# Patient Record
Sex: Female | Born: 1955 | Race: White | Hispanic: No | Marital: Married | Smoking: Never smoker
Health system: Southern US, Academic
[De-identification: ages and names within clinical notes are randomized; demographics above are authoritative.]

## PROBLEM LIST (undated history)

## (undated) DIAGNOSIS — R002 Palpitations: Secondary | ICD-10-CM

## (undated) DIAGNOSIS — I493 Ventricular premature depolarization: Secondary | ICD-10-CM

## (undated) DIAGNOSIS — E785 Hyperlipidemia, unspecified: Secondary | ICD-10-CM

## (undated) DIAGNOSIS — R001 Bradycardia, unspecified: Secondary | ICD-10-CM

## (undated) HISTORY — DX: Palpitations: R00.2

## (undated) HISTORY — DX: Bradycardia, unspecified: R00.1

## (undated) HISTORY — PX: HX LITHOTRIPSY: SHX66

## (undated) HISTORY — DX: Hyperlipidemia, unspecified: E78.5

## (undated) HISTORY — DX: Ventricular premature depolarization: I49.3

## (undated) HISTORY — PX: SINUS SURGERY: SHX187

---

## 1997-03-22 ENCOUNTER — Other Ambulatory Visit (HOSPITAL_COMMUNITY): Payer: Self-pay

## 2021-04-23 IMAGING — MG 3D SCREENING MAMMO BIL W/CAD & TOMO
5 series · 8 of 24 positions shown · non-contrast
Comparison: 12/09/2020 and 11/23/2019.

------------- REPORT [id number] -------------
﻿

JON, GENTRY
EXAM:  3D BILATERAL ANNUAL SCREENING DIGITAL MAMMOGRAM WITH CAD AND TOMOSYNTHESIS
INDICATION: Screening.

[R]
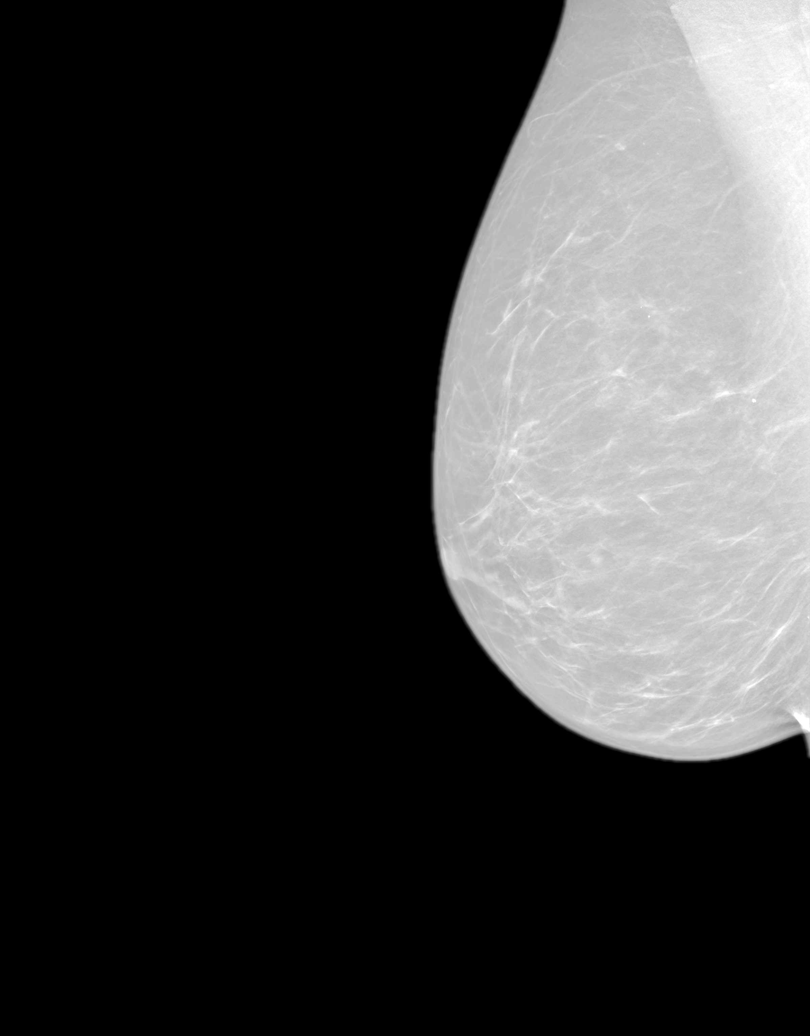

[L]
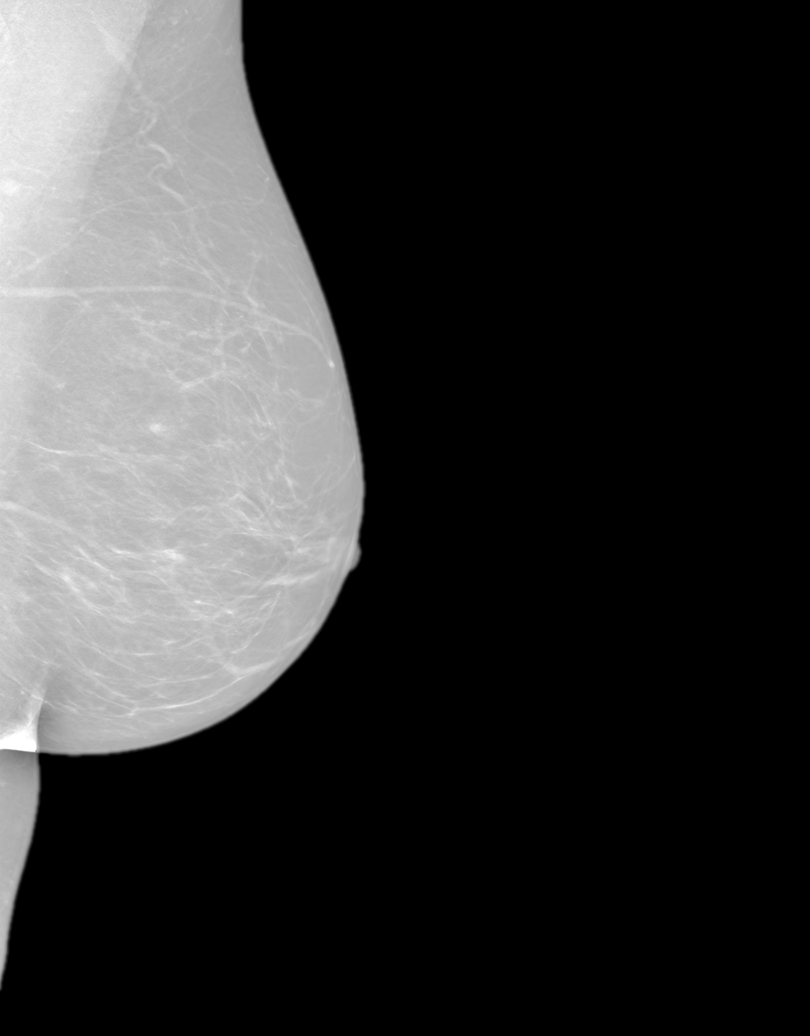

[R CC tomo · right · 0.10mm/px · 2 of 2 slices shown]
[im 1/2]
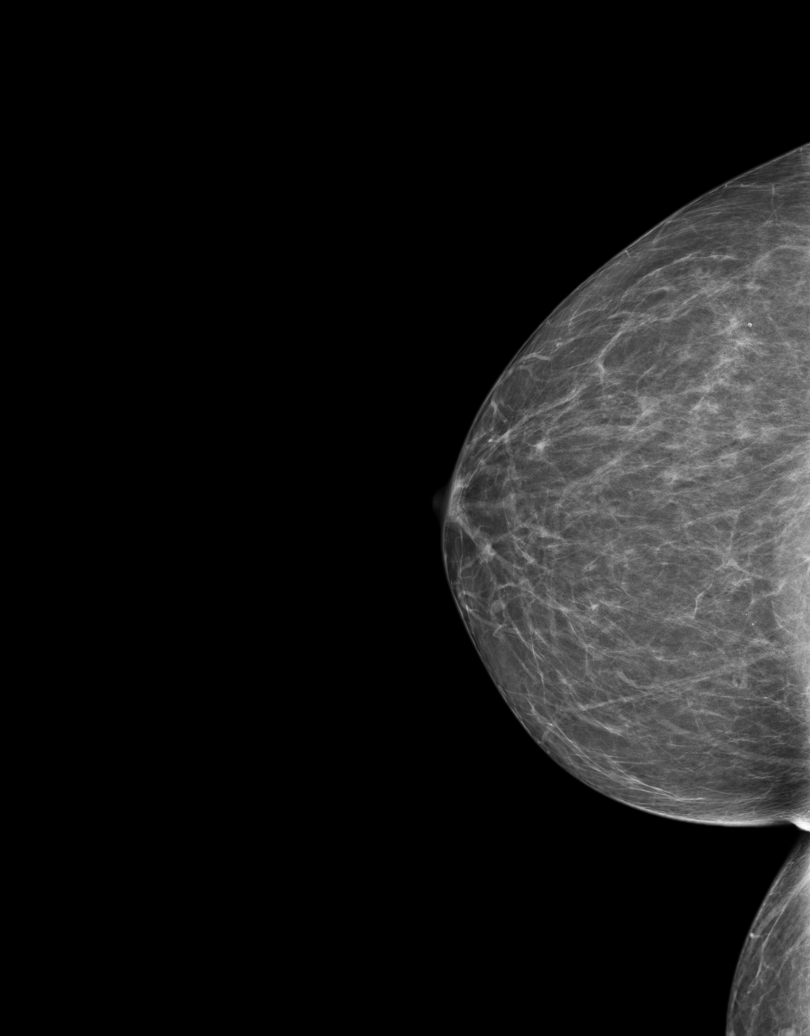
[im 2/2]
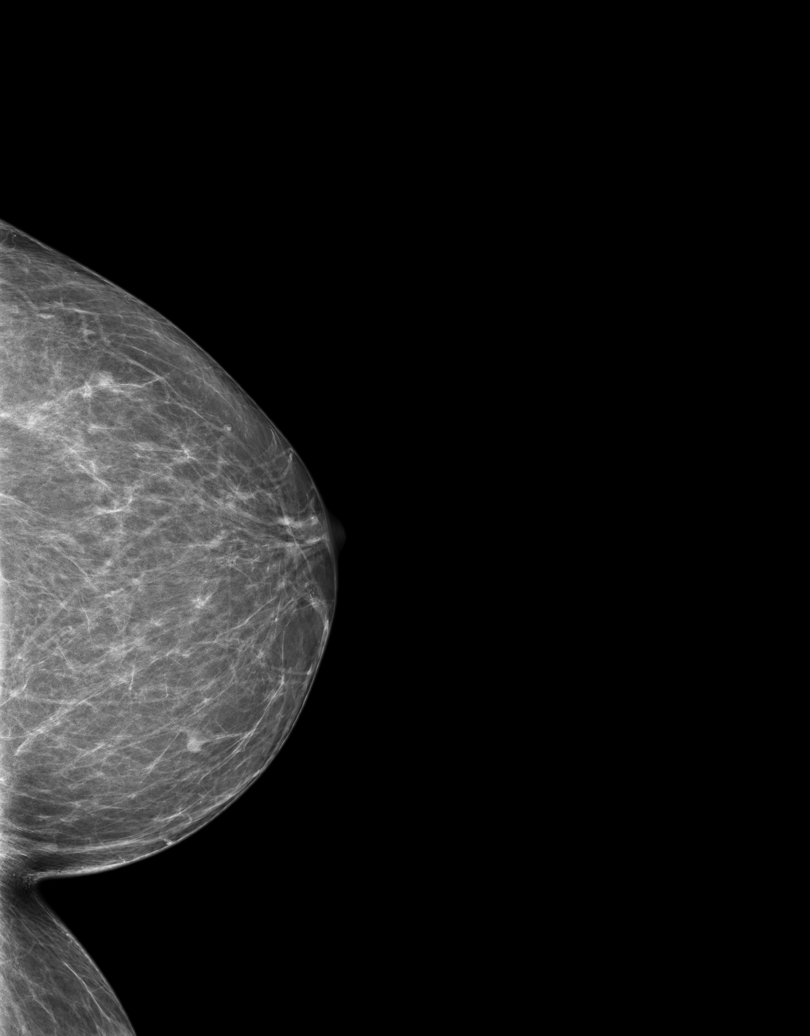

[3D SCREENING MAMMO BIL W/CAD & TOMO tomo · 2 acquisitions, 3 frames shown (1 of 2)]
[im 1/2]
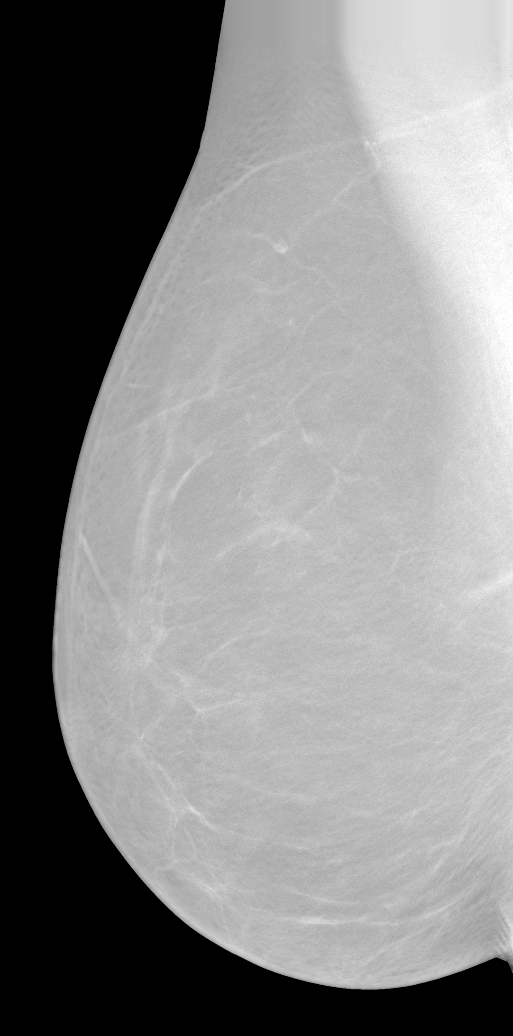
[im 2/2]
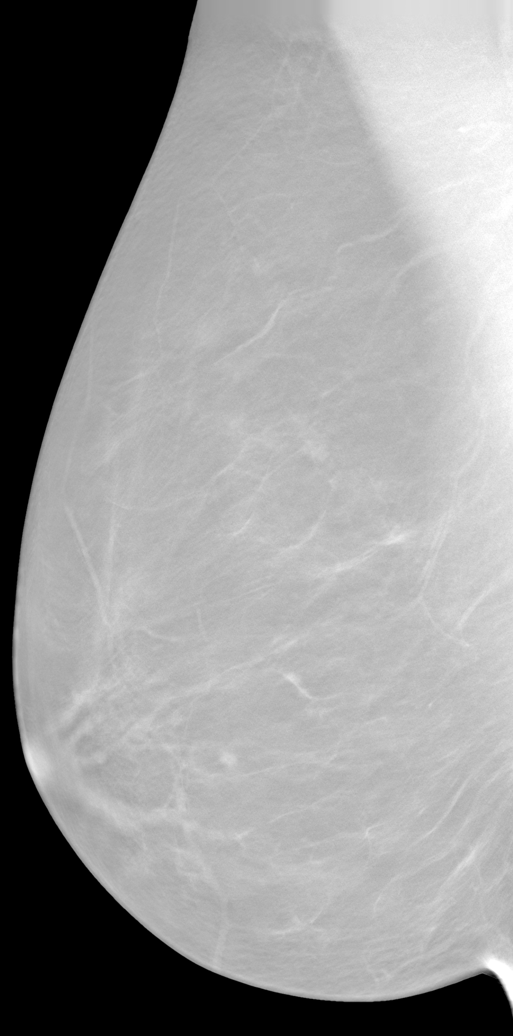
[im 2/2]
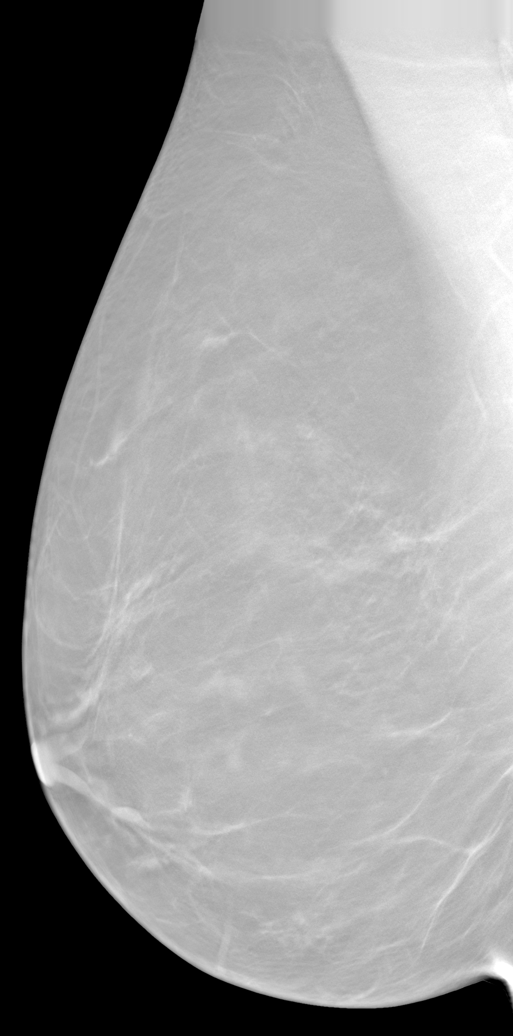

[3D SCREENING MAMMO BIL W/CAD & TOMO tomo (2 of 2) · tomo slice 13/80.0]
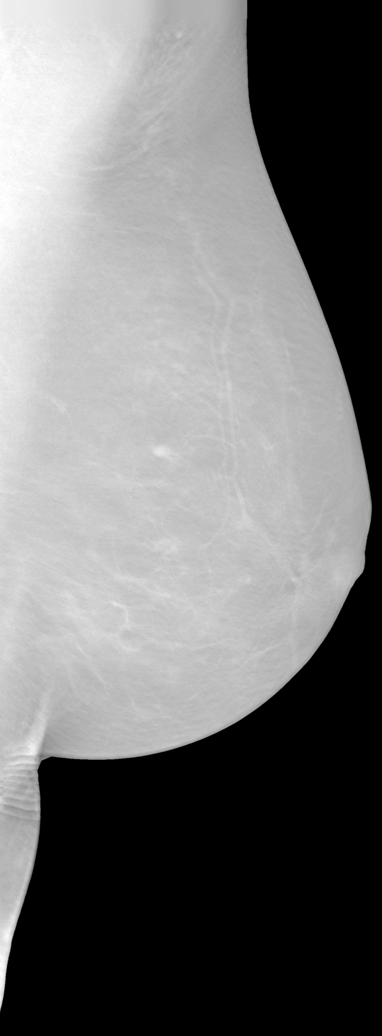

[8 of 24 positions shown; findings below may reference images not displayed]

FINDINGS: There are scattered fibroglandular elements.  There is no mass or suspicious cluster of microcalcifications.   There is no architectural distortion, skin thickening or nipple retraction.
IMPRESSION: 1.  BIRADS 2-Benign findings. Patient has been added in a reminder system with a target date for the next screening mammography.

2.  DENSITY CODE – B (Scattered areas of fibroglandular density). 

Final Assessment Code:

Bi-Rads 2 

BI-RADS 0
 Need additional imaging evaluation.

BI-RADS 1
 Negative mammogram.

BI-RADS 2
 Benign finding.

BI-RADS 3
 Probably benign finding; short-interval follow-up suggested.

BI-RADS 4
 Suspicious abnormality; biopsy should be considered.

BI-RADS 5
 Highly suggestive of malignancy; appropriate action should be taken.

BI-RADS 6
 Known biopsy-proven malignancy; appropriate action should be taken.

NOTE:
In compliance with Federal regulations, the results of this mammogram are being sent to the patient.

------------- REPORT [id number] -------------
[facility]
[address]
[place] Ms.GUILLAUME, NISHANTH:
We wish to report the following on your recent mammography examination. We are sending a report to your referring physician or other health care provider. 
(       Normal/Negative:
No evidence of cancer.
This statement is mandated by the [place], Department of Health.
Your examination was performed by one of our technologists, who are registered radiological technologists and also specially certified in mammography:
___
Markland, Marjuan (M)

Your mammogram was interpreted by our radiologist.

( 
Collette Sedman, M.D.

(Annual Breast Examination by a physician or other health care provider
(Annual Mammography Screening beginning at age 40
(Monthly Breast Self Examination

## 2022-04-27 IMAGING — MG 3D SCREENING MAMMO BIL AND TOMO
5 series · 8 of 24 positions shown · non-contrast
Comparison: Exam dated 06/27/2022, 11/13/2020.

------------- REPORT [id number] -------------
[facility]
[address]
[place]/MS/DE REYES, MIKHAIL
We wish to report the following on your recent mammography examination. We are sending a report to your referring physician or other health care provider.
INDICATION: Asymptomatic 66-year-old with no family history. Lifetime breast cancer risk 6.3%.

[R]
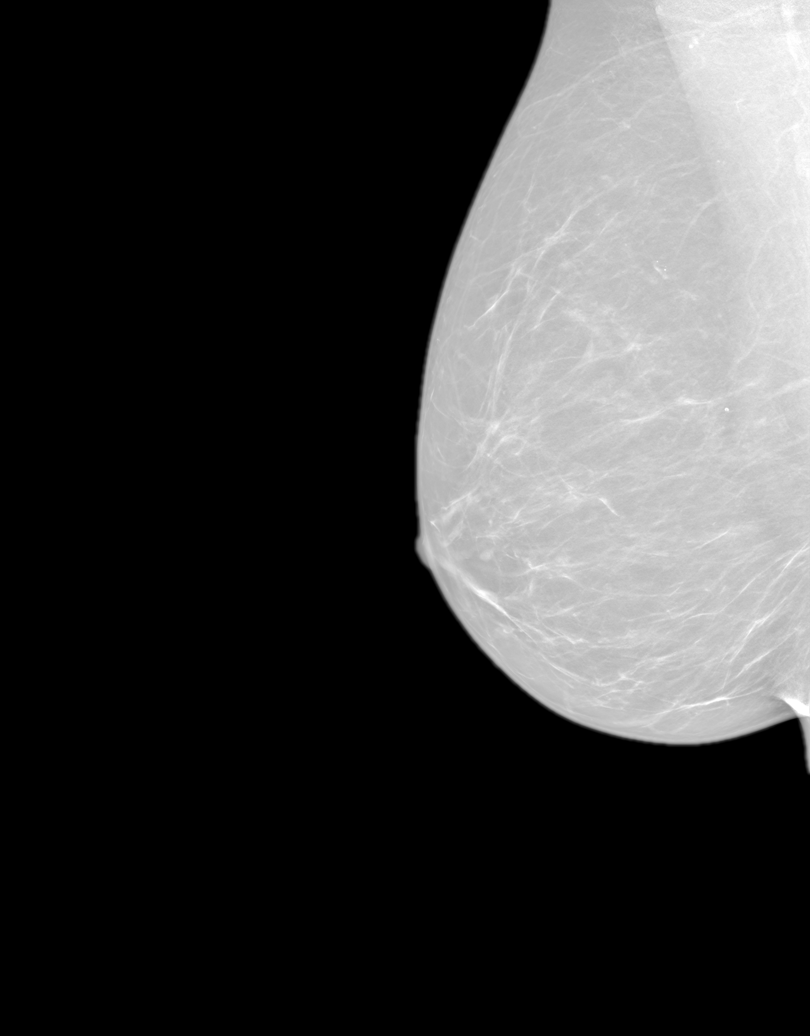

[L]
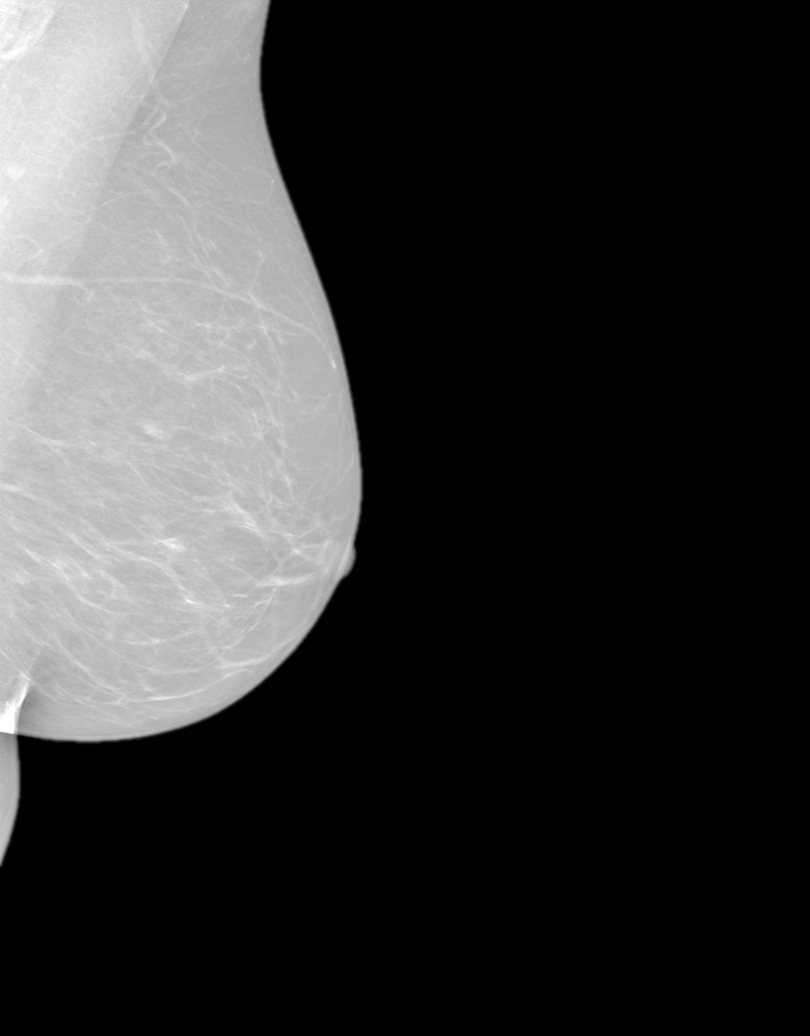

[R CC tomo · right · 0.10mm/px · 2 of 2 slices shown]
[im 1/2]
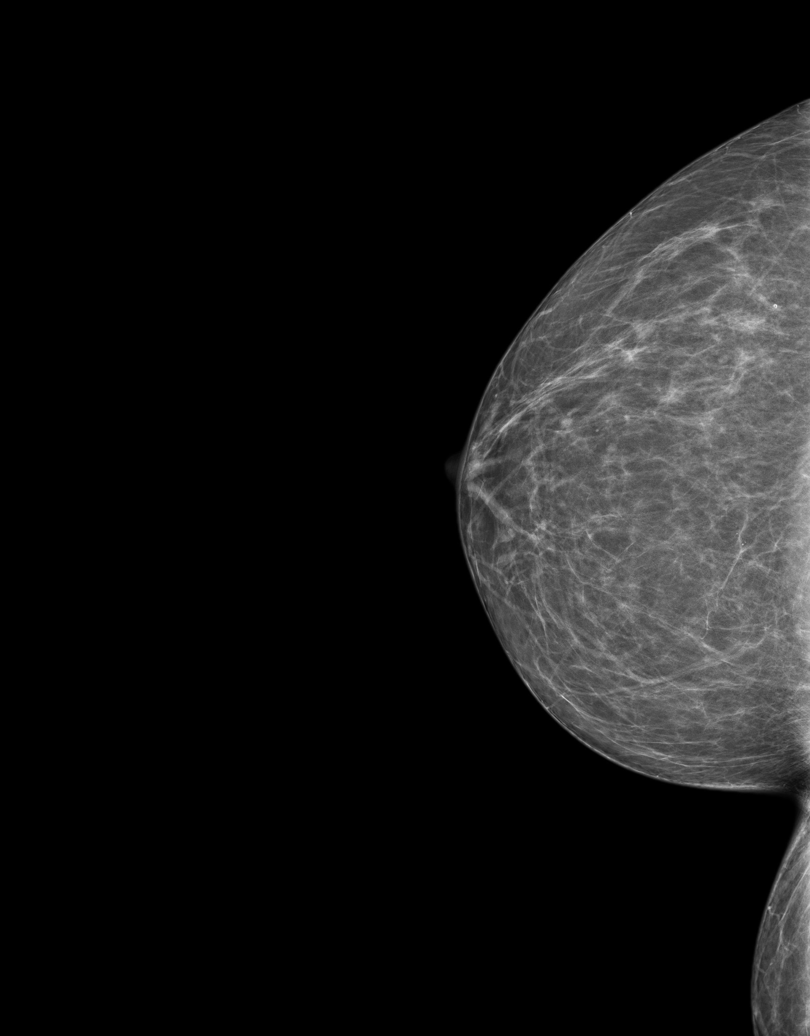
[im 2/2]
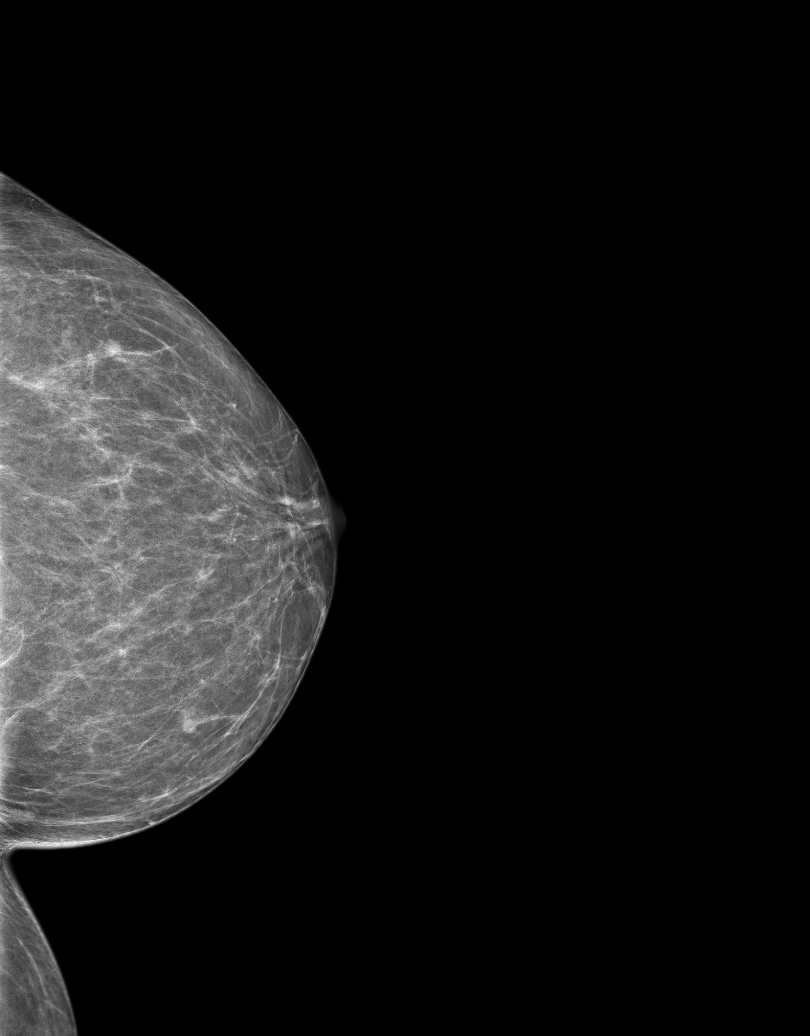

[3D SCREENING MAMMO BIL AND TOMO tomo · 2 acquisitions, 3 frames shown (1 of 2)]
[im 1/2]
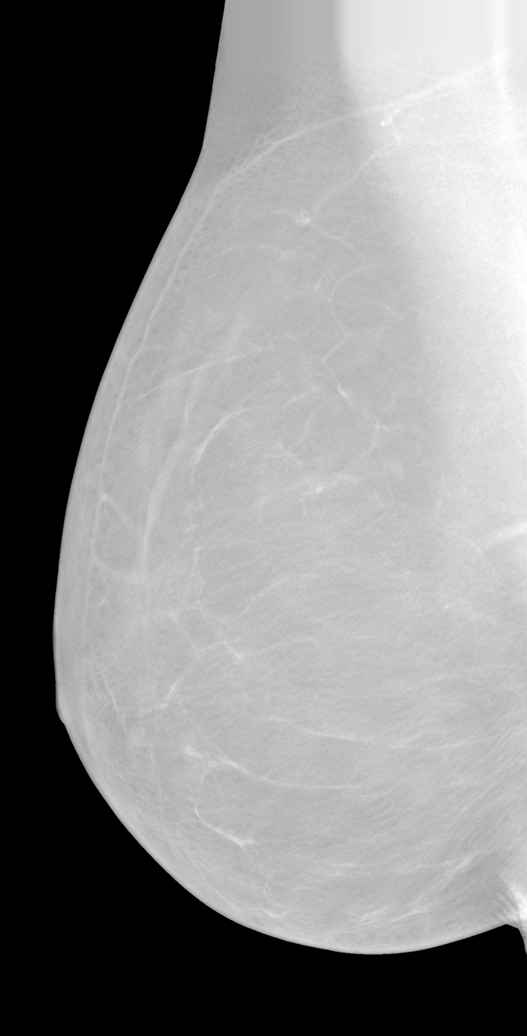
[im 2/2]
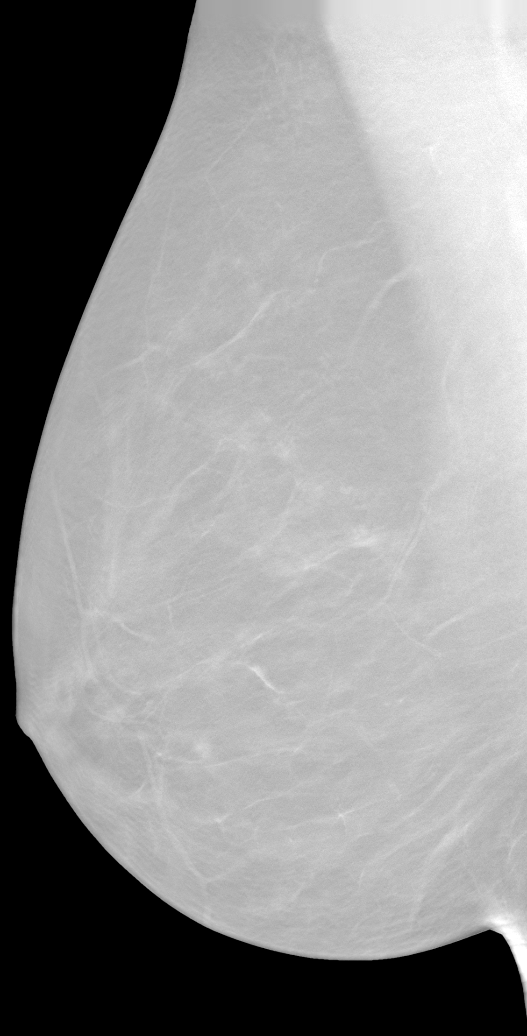
[im 2/2]
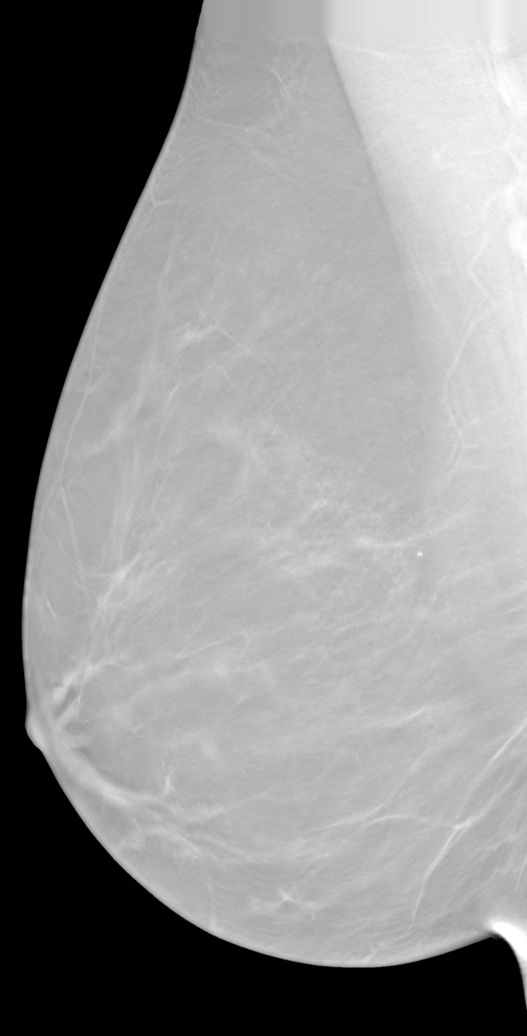

[3D SCREENING MAMMO BIL AND TOMO tomo (2 of 2) · tomo slice 12/76.0]
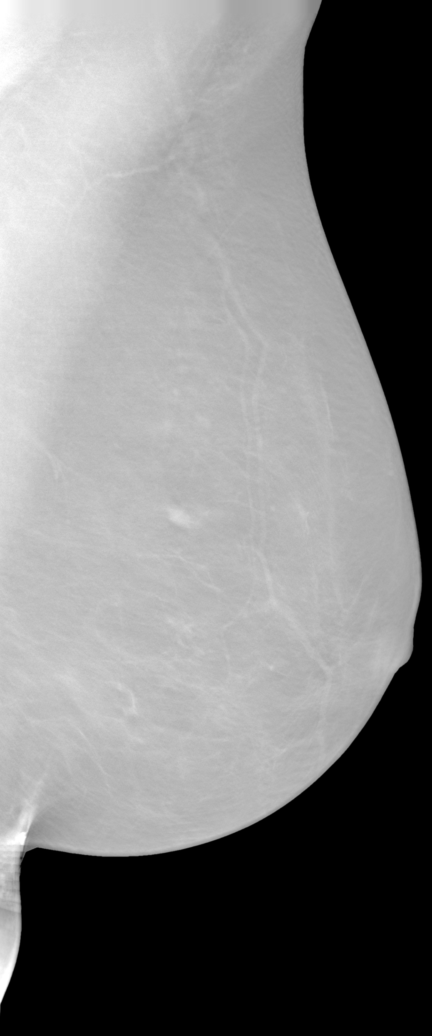

[8 of 24 positions shown; findings below may reference images not displayed]

FINDING: Normal-no evidence of cancer

This statement is mandated by the [place], Department of Health.
Your examination was performed by one of our technologists, who are registered radiological technologists and also specially certified in mammography:
___
Marzan, Heron (M)

Your mammogram was interpreted by our radiologist.

( 
Apple Martha, M.D.

(Annual Breast Examination by a physician or other health care provider
(Annual Mammography Screening beginning at age 40
(Monthly Breast Self Examination

------------- REPORT [id number] -------------
﻿

EXAM:  3D SCREENING MAMMO BIL AND TOMO
FINDINGS: No new mass or architectural changes are noted.  No abnormal calcific densities skin change nipple change or duct dilation are seen.
IMPRESSION: 1.  BIRADS 2-Benign findings. Patient has been added in a reminder system with a target date for the next screening mammography.

2.  DENSITY CODE –A (Almost entirely fatty)   

Final Assessment Code:

BI-RADS 0
 Need additional imaging evaluation.

BI-RADS 1
 Negative mammogram.

BI-RADS 2
 Benign finding.

BI-RADS 3
 Probably benign finding; short-interval follow-up suggested.

BI-RADS 4
 Suspicious abnormality; biopsy should be considered.

BI-RADS 5
 Highly suggestive of malignancy; appropriate action should be taken.

BI-RADS 6
 Known biopsy-proven malignancy; appropriate action should be taken.

NOTE:
In compliance with Federal regulations, the results of this mammogram are being sent to the patient.

Electronically Signed by NAGOSHI, HONOMI at [id number] [DATE]

## 2022-05-26 ENCOUNTER — Encounter (INDEPENDENT_AMBULATORY_CARE_PROVIDER_SITE_OTHER): Payer: Self-pay | Admitting: INTERVENTIONAL CARDIOLOGY

## 2022-05-31 ENCOUNTER — Ambulatory Visit (INDEPENDENT_AMBULATORY_CARE_PROVIDER_SITE_OTHER): Payer: Medicare Other | Admitting: NURSE PRACTITIONER

## 2022-05-31 ENCOUNTER — Encounter (INDEPENDENT_AMBULATORY_CARE_PROVIDER_SITE_OTHER): Payer: Self-pay | Admitting: NURSE PRACTITIONER

## 2022-05-31 ENCOUNTER — Other Ambulatory Visit: Payer: Self-pay

## 2022-05-31 VITALS — BP 140/94 | HR 77 | Ht 61.0 in | Wt 139.0 lb

## 2022-05-31 DIAGNOSIS — I493 Ventricular premature depolarization: Secondary | ICD-10-CM

## 2022-05-31 DIAGNOSIS — I10 Essential (primary) hypertension: Secondary | ICD-10-CM

## 2022-05-31 DIAGNOSIS — E785 Hyperlipidemia, unspecified: Secondary | ICD-10-CM

## 2022-05-31 NOTE — Progress Notes (Signed)
[facility]iology [facility]    Name: Cathy Nichols  Age: 66 y.o.  Date of Service: 05/31/2022    Primary Care Provider: No Pcp  Chief Complaint:   Chief Complaint   Patient presents with    Follow Up     Annual Appt       Subjective:  The patient has a history of palpitations and ventricular ectopy.  Echocardiogram and Holter monitor in 2015 showed fairly normal echo, Holter monitor showed frequent PVCs, about 1400 beats in 24 hours.  She also had some heart rates in the 160s while exerting herself.  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  Repeat Holter monitor on beta-blocker showed some low heart rates, but no high-grade AV block or advanced AV block.  There was some infrequent tachyarrhythmias.  Repeat echo in December 2022 showed normal LV function with EF 0000000, mild diastolic dysfunction, mild TR.    05/31/22 The patient is here for yearly f/u.  She denies any significant chest pains or shortness of breath.  Denies any palpitations.  She has been tolerating medications well except for Crestor, she is wondering if this was contributing to leg cramps.  She did stop it about a month ago, but her symptoms did not really change much.  She states her legs cramp significantly when she starts walking, longer she walks though the better it gets. Labs in October showed BUN 17, creatinine 0.74, sodium 140, potassium 5.0, total cholesterol 183, triglycerides 107, HDL 57, LDL 107.    Past Medical History:  Past Medical History:   Diagnosis Date    Bradycardia     Dyslipidemia     Heart palpitations     Ventricular ectopy          Social History:  Social History     Tobacco Use   Smoking Status Never   Smokeless Tobacco Never      Social History     Substance and Sexual Activity   Alcohol Use Never      Social History     Substance and Sexual Activity   Drug Use Not on file      Current Medications:  Current Outpatient Medications   Medication Sig    aspirin (VAZALORE) 81 mg Oral  Capsule Take by mouth Once a day    Cholecalciferol, Vitamin D3, 25 mcg (1,000 unit) Oral Capsule Take 1 Capsule (1,000 Units total) by mouth Once a day    levothyroxine (SYNTHROID) 88 mcg Oral Tablet Take 1 Tablet (88 mcg total) by mouth    metoprolol succinate (TOPROL-XL) 25 mg Oral Tablet Sustained Release 24 hr Take 12.5 Tablets (312.5 mg total) by mouth Every morning    montelukast (SINGULAIR) 10 mg Oral Tablet Take 1 Tablet (10 mg total) by mouth Every evening 12.20.22    rosuvastatin (CRESTOR) 10 mg Oral Tablet Take 1 Tablet (10 mg total) by mouth Once a day     Allergies:  No Known Allergies   Review of Systems:  Complete ROS was performed and otherwise negative unless noted in HPI.    Vital Signs:  Vitals:    05/31/22 0904   BP: (!) 140/94   Pulse: 77   SpO2: 96%   Weight: 63 kg (139 lb)   Height: 1.549 m (5\' 1" )   BMI: 26.32      Physical Exam:  General: Pt resting comfortably in no acute distress and appears stated age.    Neck: No JVD,  no carotid bruit. Neck supple, symmetrical, trachea midline.   Lungs:  Normal respiratory effort, lungs clear to auscultation bilaterally.    Cardiovascular:  Regular rate and rhythm.  Normal S1 and S2 without murmur, gallop, or rub.  Abdomen: Soft, non-tender and bowel sounds normal.    Extremities: Extremities normal, atraumatic, no cyanosis or edema.    Neurologic: Alert and oriented x3.     Assessment:    Ventricular ectopy    Essential hypertension    Hyperlipidemia, unspecified hyperlipidemia type      Plan:   Palpitations appear to be stable.  Continue current medications.  Blood pressure borderline today, recommend she monitor this and follow up with PCP.  Continue with Crestor, her leg cramps what is resolved if it was coming from this.  Recommend she try some Co Q10 and also staying hydrated before her walks.  Return in 1 year.    Orders placed this visit:  Orders Placed This Encounter    EKG (In-Clinic Today)       Cathy Nichols is to return to clinic for  follow up with the understanding that should symptoms change or worsen she is to call the office or go to the closest emergency department for evaluation.    Amedeo Kinsman, APRN,FNP-BC    A portion of this documentation may have been generated using MMODAL voice recognition software and may contain syntax/voice recognition errors.

## 2022-06-13 LAB — ECG W INTERP (AMB USE ONLY)(MUSE,IN CLINIC)
Atrial Rate: 68 {beats}/min
Calculated P Axis: -11 degrees
Calculated R Axis: 58 degrees
Calculated T Axis: 65 degrees
PR Interval: 124 ms
QRS Duration: 94 ms
QT Interval: 386 ms
QTC Calculation: 410 ms
Ventricular rate: 68 {beats}/min

## 2022-07-14 IMAGING — DX XRAY KNEE 3 VIEWS LT
1 series · 3 of 3 positions shown · non-contrast
Comparison: None available.

﻿EXAM:  [id number]   XRAY KNEE 3 VIEWS LT
INDICATION: Chronic left knee pain. No history of trauma.
TECHNIQUE: Three views including AP weight-bearing view.

[Series 1: apweightbearing · 0.14mm/px · 3 of 3 slices shown]
[im 1/3]
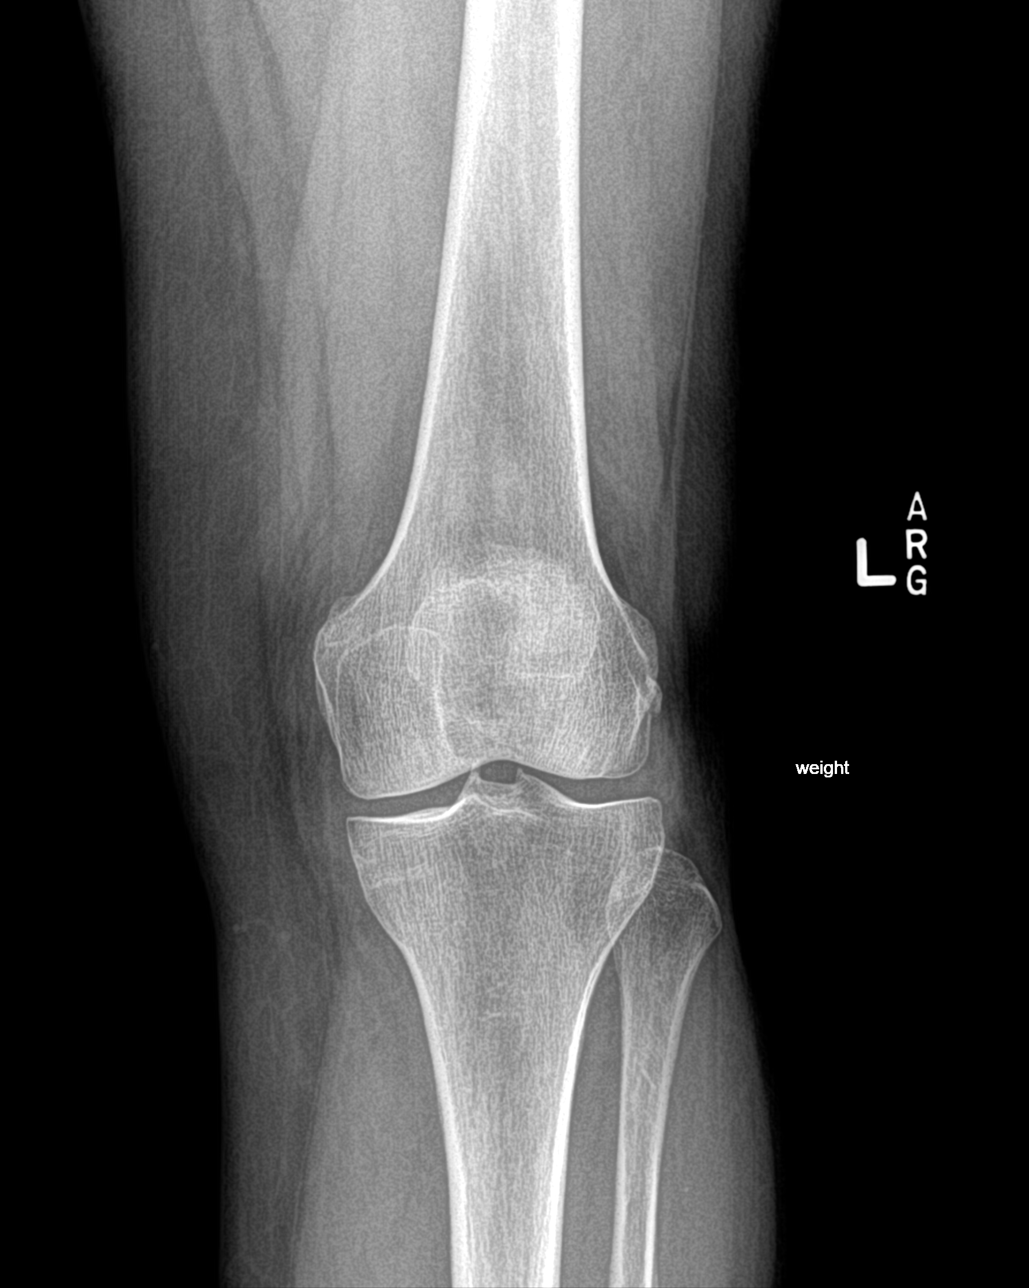
[im 2/3]
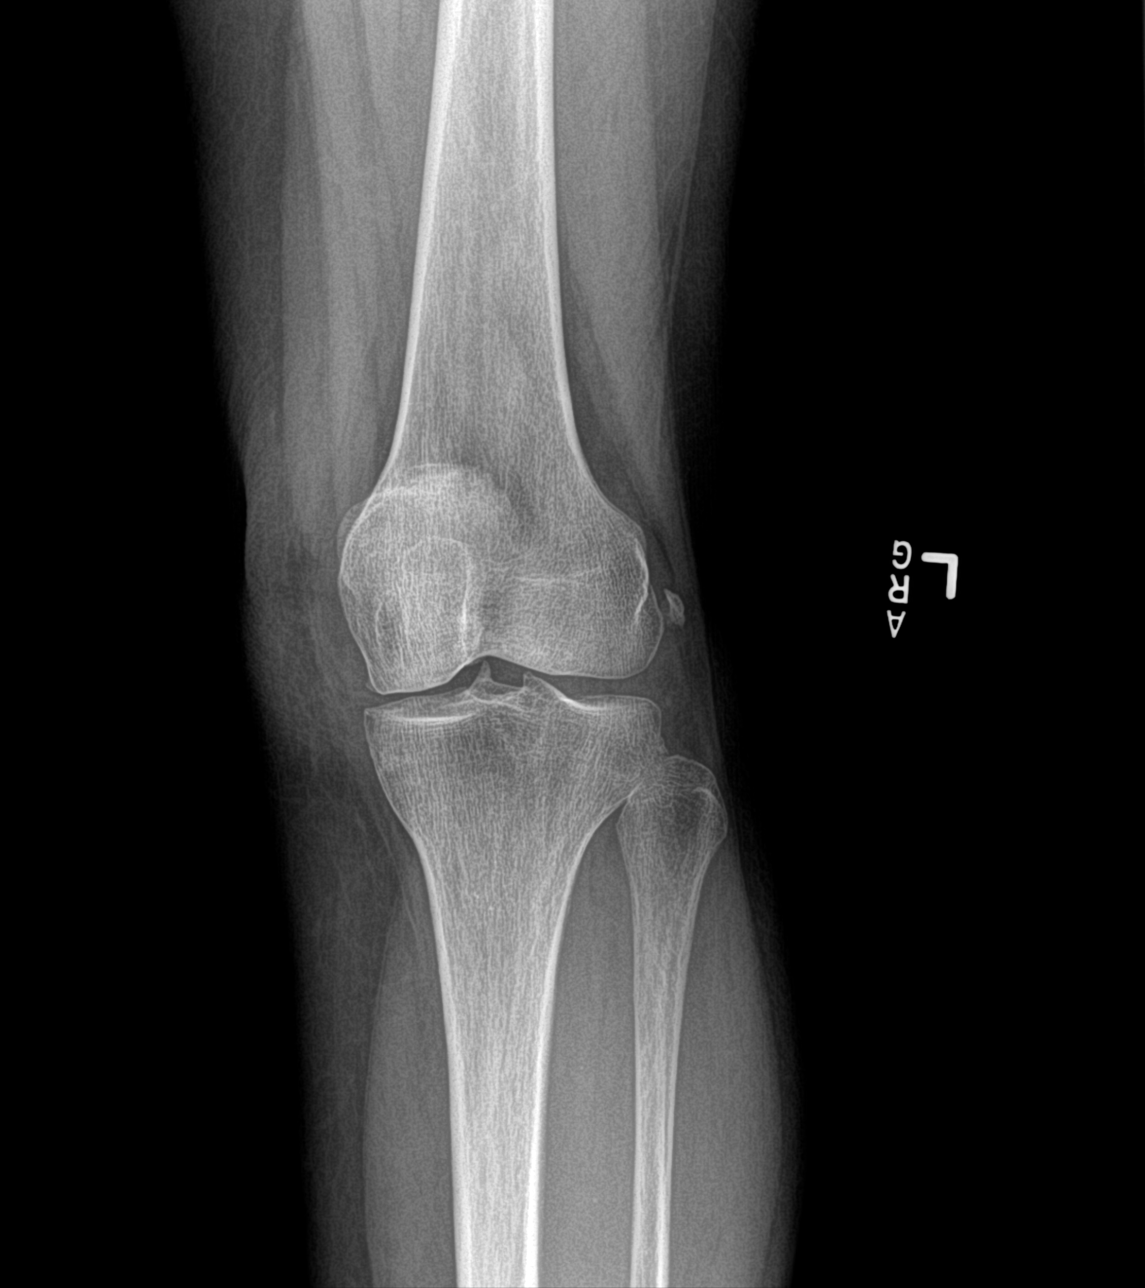
[im 3/3]
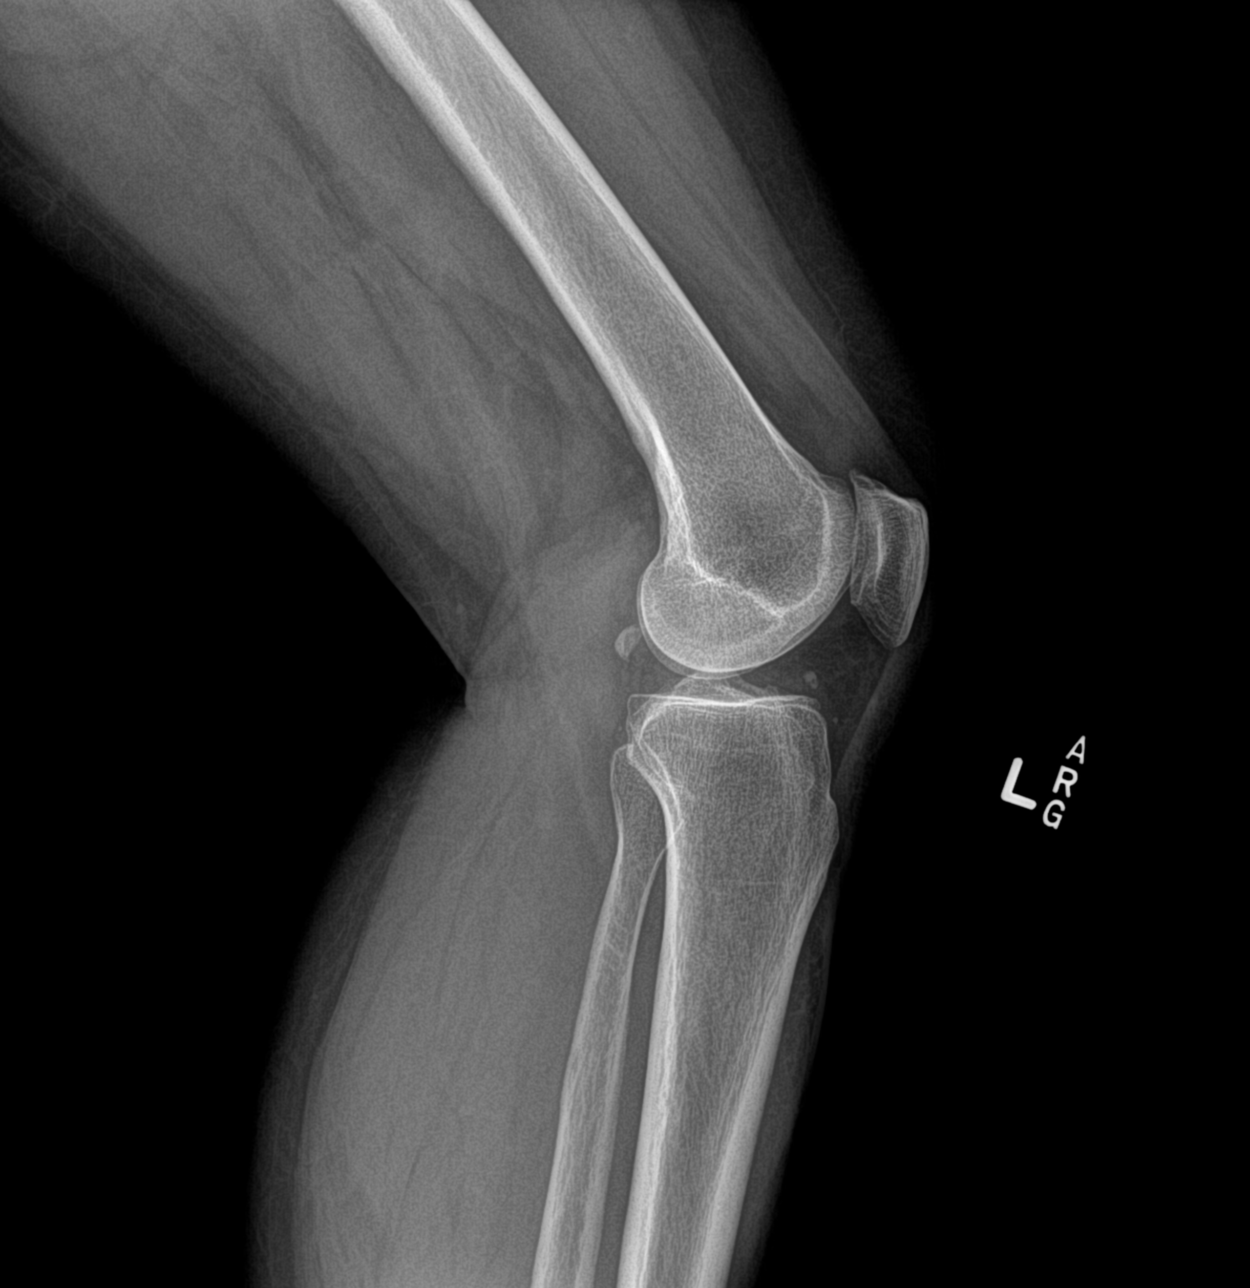

[3 of 3 positions shown; findings below may reference images not displayed]

FINDINGS: No acute bony lesions. Mild grade 2 degenerative changes of medial compartment. Lateral compartment and patellofemoral joint are normal.  Soft tissues are normal.
IMPRESSION: Mild degenerative changes of medial compartment of the left knee.  If symptoms are localized and persistent, further imaging may be considered with MRI.

## 2022-08-05 IMAGING — MR MRI KNEE LT W/O CONTRAST
6 series · 40 of 40 positions shown · IV contrast (gadolinium)
Comparison: None available.

﻿EXAM:  [id number]   MRI KNEE LT W/O CONTRAST
INDICATION: 66-year-old female sustained injury, walking on treadmill recently.  Medial left knee pain and swelling with diminished range of motion.  No history of knee surgery.
TECHNIQUE: Multiplanar, multisequential MRI of the left knee was performed without gadolinium contrast.

[Series 4: s-map · axial · left · 7.8mm · 3.91mm/px · z∈[-117,+187]mm · 14 of 80 slices shown]
[im 1/80]
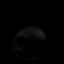
[im 7/80]
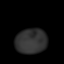
[im 13/80]
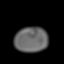
[im 19/80]
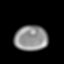
[im 25/80]
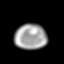
[im 31/80]
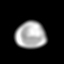
[im 37/80]
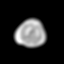
[im 43/80]
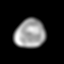
[im 49/80]
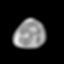
[im 55/80]
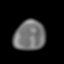
[im 61/80]
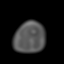
[im 67/80]
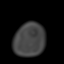
[im 73/80]
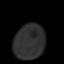
[im 80/80]
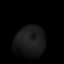

[Series 5: PD fat-sat · axial · left · 4.0mm · 0.53mm/px · z∈[-43,+87]mm · 6 of 30 slices shown (1 of 3)]
[im 1/30]
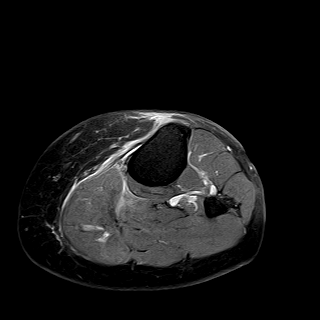
[im 6/30]
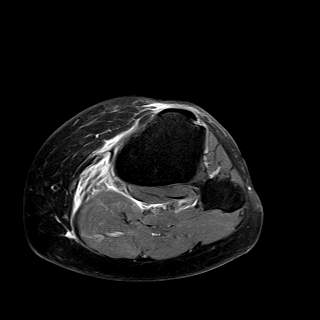
[im 12/30]
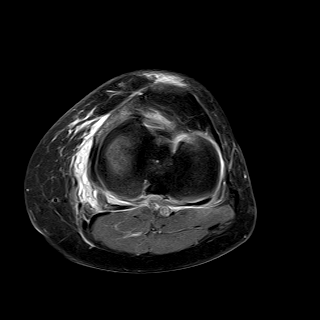
[im 18/30]
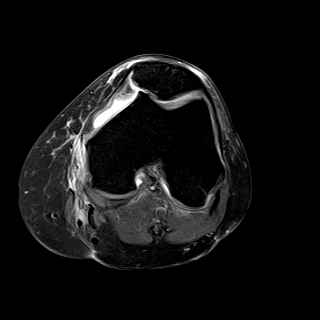
[im 24/30]
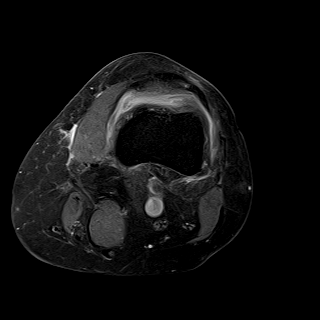
[im 30/30]
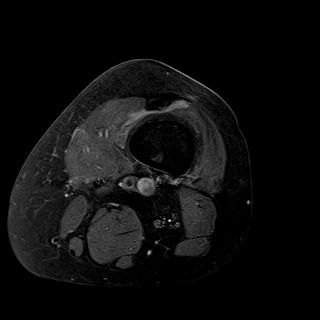

[Series 6: PD fat-sat · sagittal · left · 3.0mm · 0.47mm/px · 6 of 30 slices shown (2 of 3)]
[im 1/30]
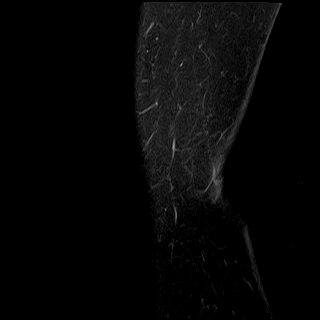
[im 6/30]
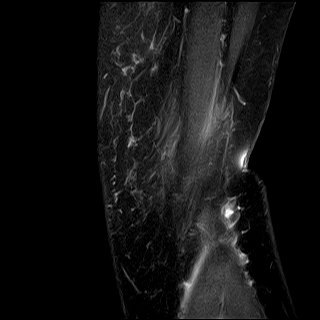
[im 12/30]
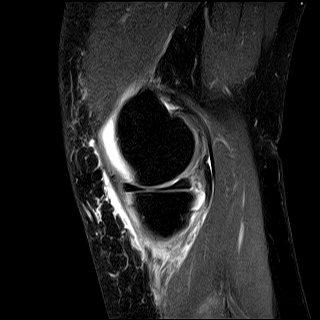
[im 18/30]
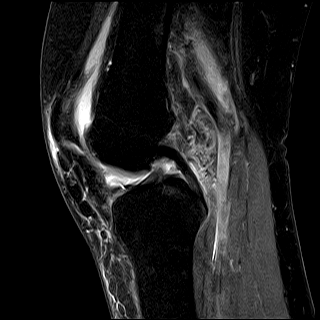
[im 24/30]
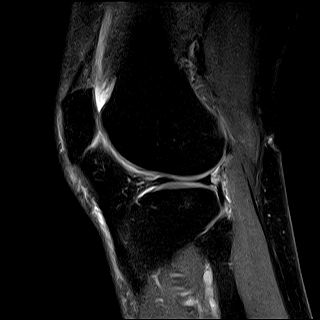
[im 30/30]
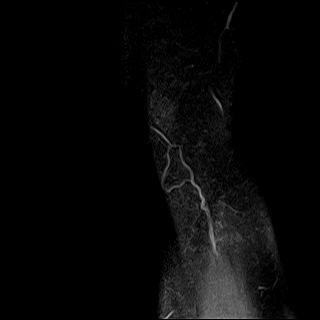

[Series 7: T1 · sagittal · left · 3.0mm · 0.39mm/px · 6 of 30 slices shown]
[im 1/30]
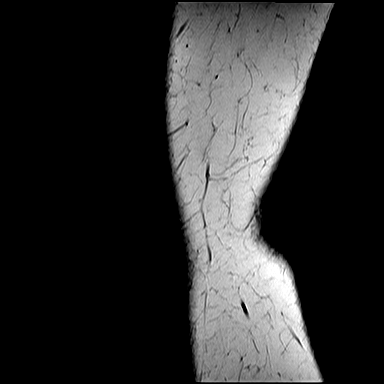
[im 6/30]
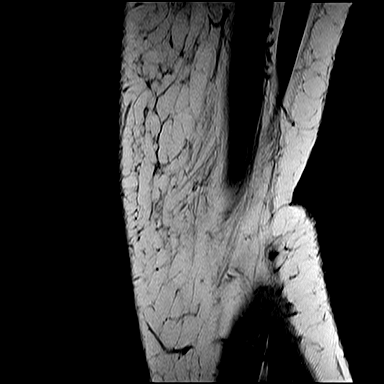
[im 12/30]
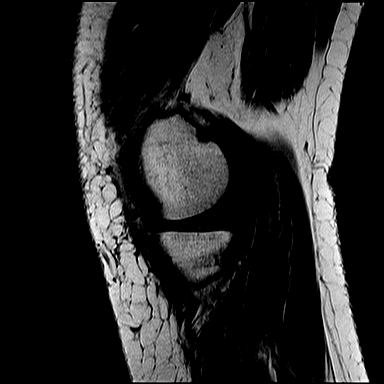
[im 18/30]
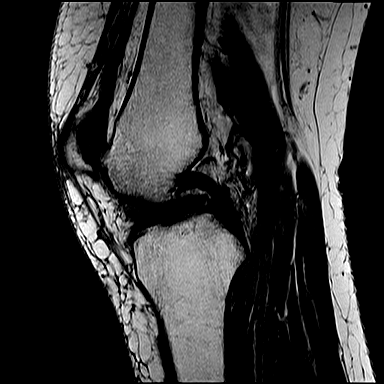
[im 24/30]
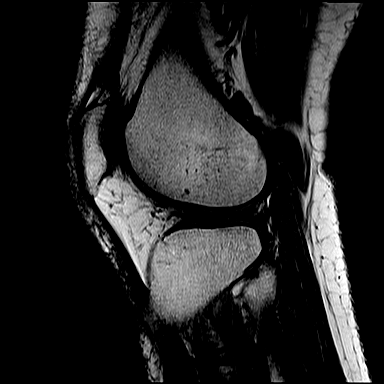
[im 30/30]
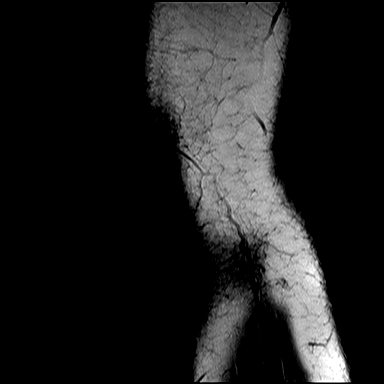

[Series 8: STIR · coronal · left · 3.5mm · 0.47mm/px · 4 of 22 slices shown]
[im 1/22]
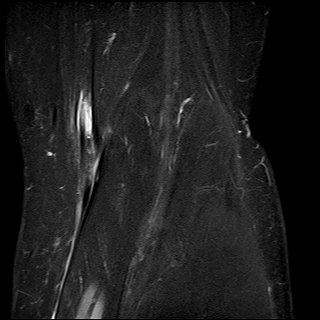
[im 8/22]
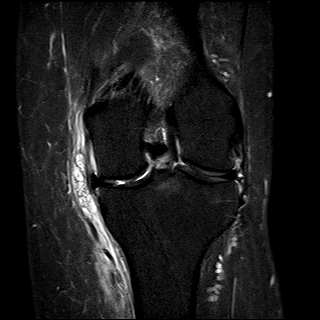
[im 15/22]
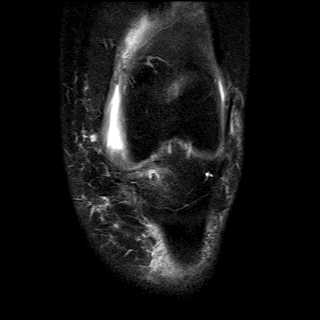
[im 22/22]
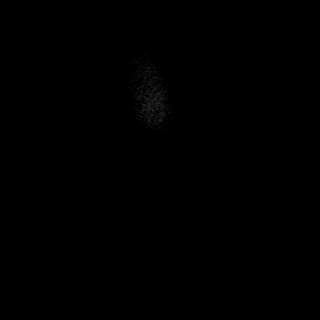

[Series 9: PD fat-sat · coronal · left · 3.5mm · 0.47mm/px · 4 of 22 slices shown (3 of 3)]
[im 1/22]
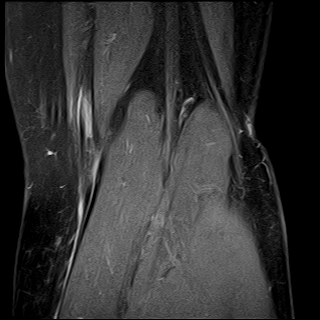
[im 8/22]
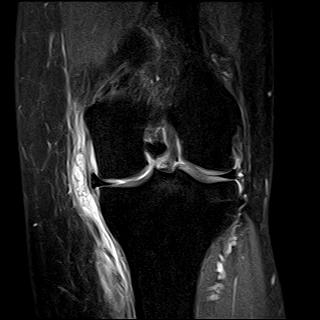
[im 15/22]
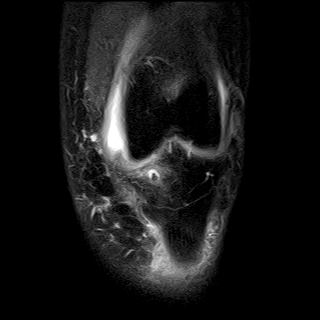
[im 22/22]
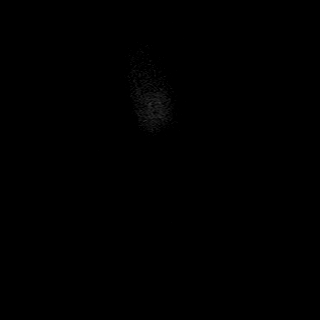

[40 of 40 positions shown; findings below may reference images not displayed]

FINDINGS: No acute fracture or bone bruise is noted at the left knee. 

Lateral meniscus and lateral articular cartilage are intact. 

Anterior and posterior cruciate ligaments are intact.

Vertically oriented meniscal tear of the posterior horn of medial meniscus near the meniscal root is noted.  Medial extrusion of mid medial meniscus is noted. 

Grade 2 degenerative changes of medial articular cartilage over medial femoral condyle are noted.

There is significant bruising in the region of medial collateral ligament with separation of superficial and deep fibers and partial disruption of the superficial aspect of MCL. Findings are suggestive of high-grade sprained medial collateral ligament with hematoma between the superficial and deep layers.

Quadriceps tendon and patellar tendon are intact.  Small effusion is noted in the knee joint.  Mild infrapatellar bursitis.
IMPRESSION: 1. No acute fracture or bone bruise is noted at the left knee. 

2. Vertically oriented meniscal tear of the posterior horn of medial meniscus near the meniscal root is noted.  Medial extrusion of mid medial meniscus is noted

3. There is significant bruising in the region of medial collateral ligament with separation of superficial and deep fibers and partial disruption of the superficial aspect of MCL. Findings are suggestive of high-grade sprained medial collateral ligament with hematoma between the superficial and deep layers.

4. Grade 2 degenerative changes of articular cartilage over medial femoral condyle.

Electronically Signed by MOATSHE, NOMASIBULELE at [id number] [DATE]

## 2023-05-01 IMAGING — MG 3D SCREENING MAMMO BIL AND TOMO
3 series · 8 of 24 positions shown · non-contrast
Comparison: 05/02/2023, 04/28/2022, 06/04/2017.

------------- REPORT [id number] -------------
﻿

EXAM:  3D SCREENING MAMMO BIL AND TOMO
INDICATION: Screening mammogram.  Asymptomatic 67-year-old with no family history.  Lifetime breast cancer risk 6.3%.

[R CC tomo · right · 0.10mm/px · 4 of 4 slices shown]
[im 1/4]
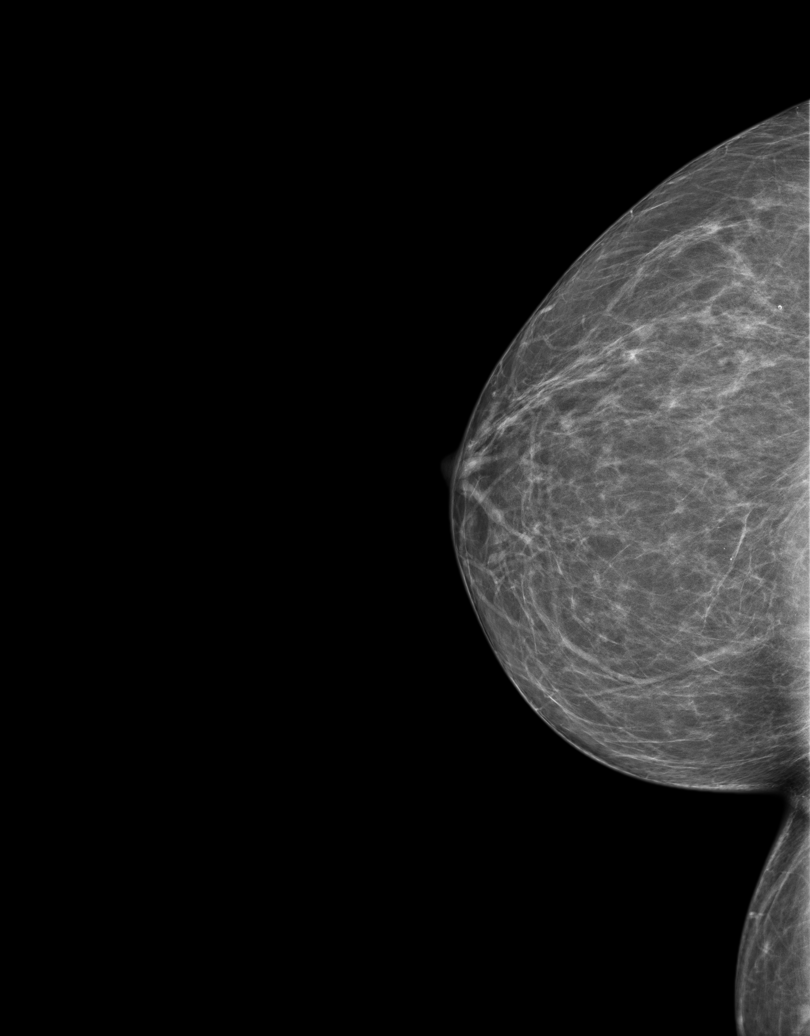
[im 2/4]
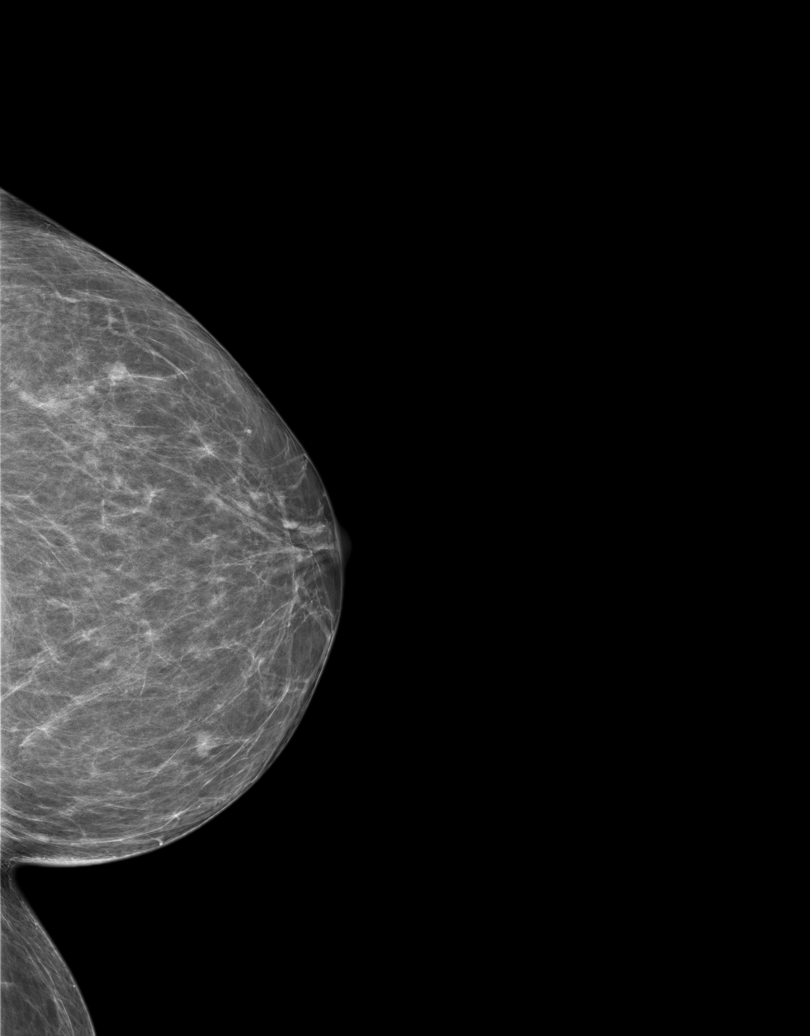
[im 3/4]
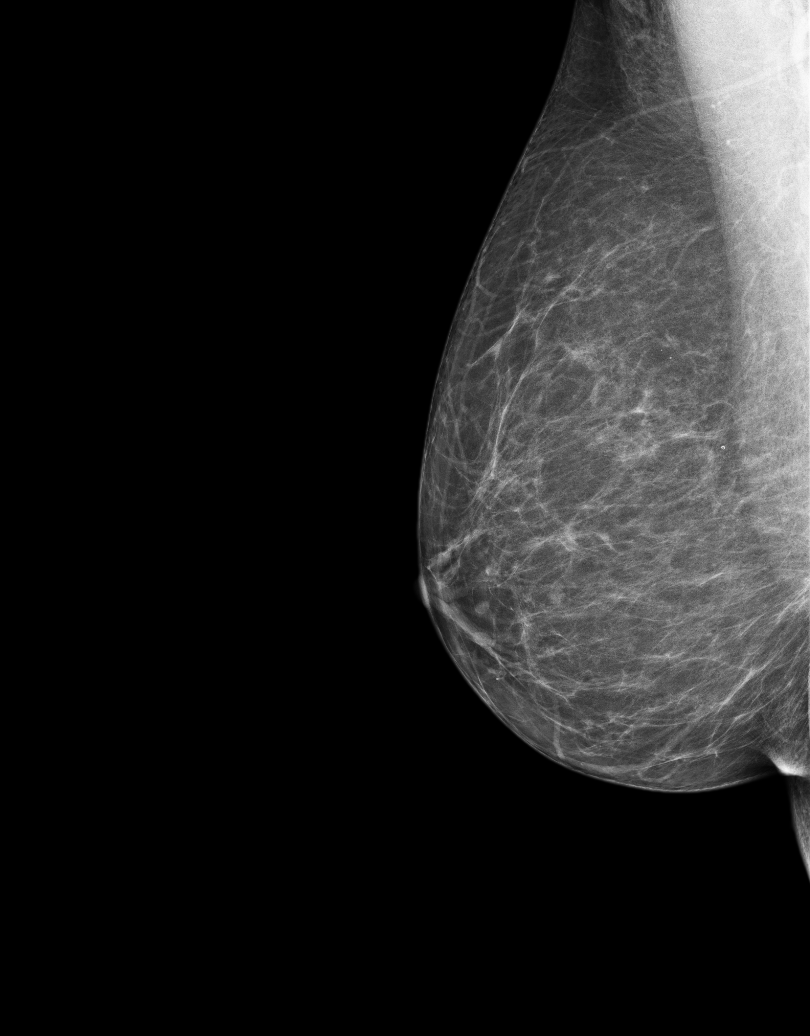
[im 4/4]
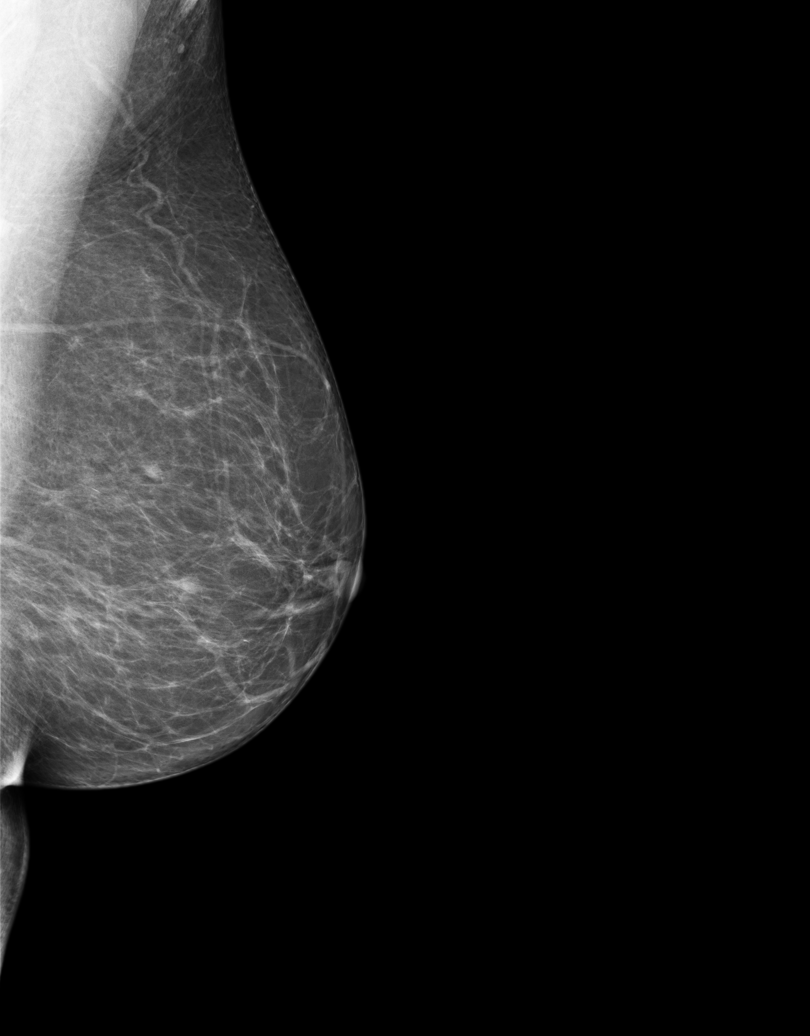

[3D SCREENING MAMMO BIL AND TOMO tomo · 2 acquisitions, 3 frames shown (1 of 2)]
[im 1/2]
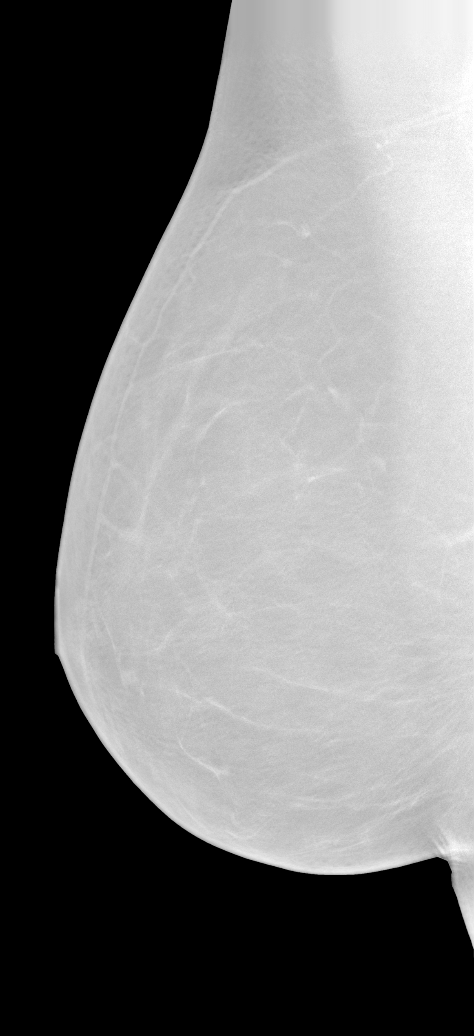
[im 2/2]
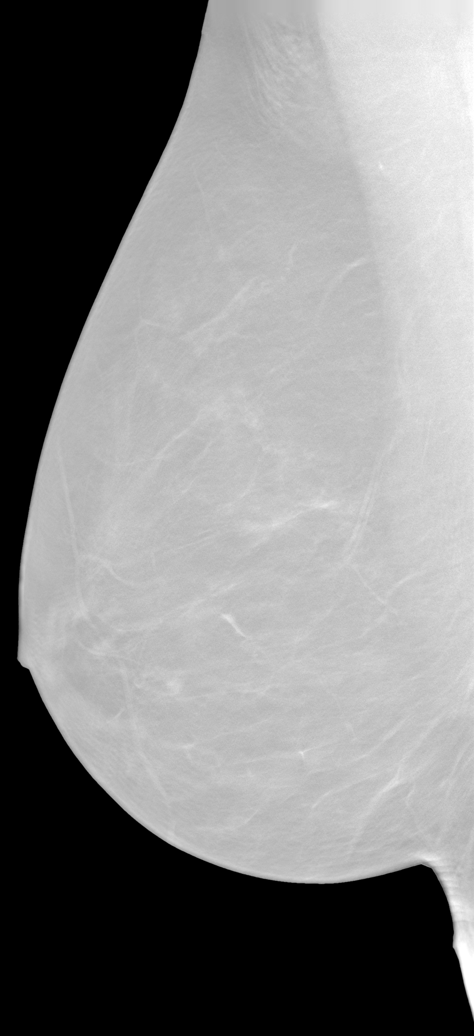
[im 2/2]
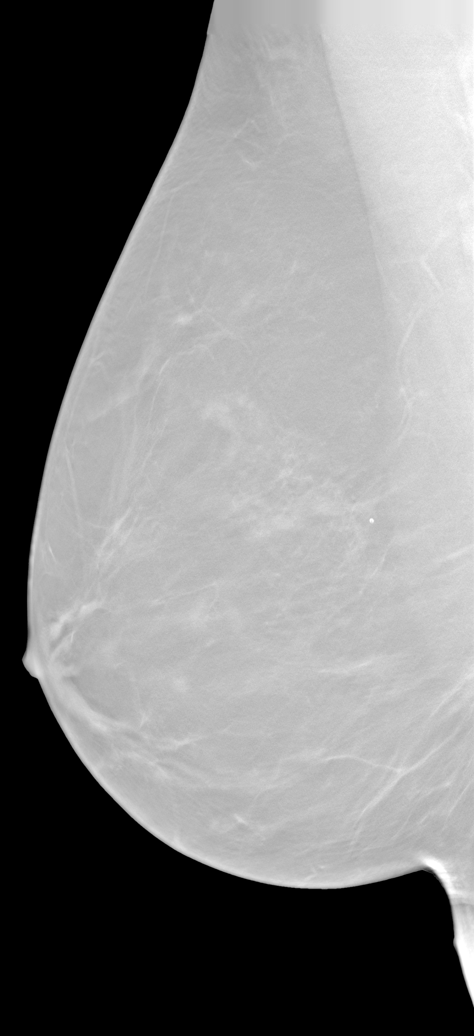

[3D SCREENING MAMMO BIL AND TOMO tomo (2 of 2) · tomo slice 13/78.0]
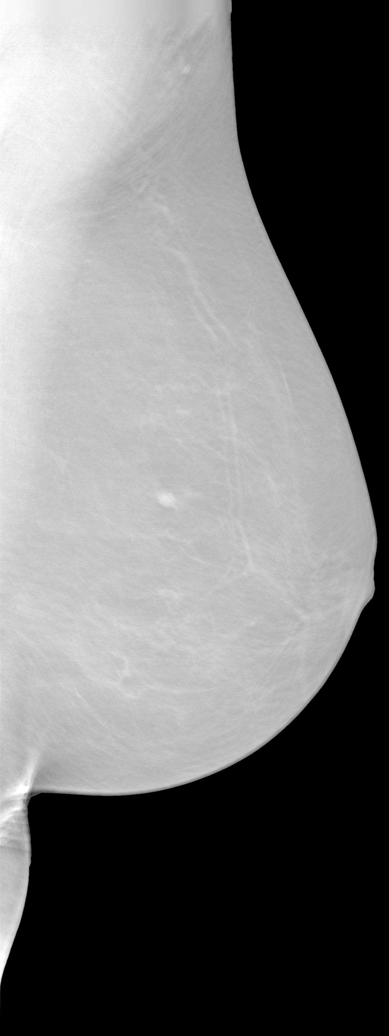

[8 of 24 positions shown; findings below may reference images not displayed]

FINDINGS: There are scattered fibroglandular elements.  There is no mass or suspicious cluster of microcalcifications.   There is no architectural distortion, skin thickening or nipple retraction.
IMPRESSION: 1.  BIRADS 2-Benign findings. Patient has been added in a reminder system with a target date for the next screening mammography.

2.  DENSITY CODE –  B (Scattered areas of fibroglandular density) 

Final Assessment Code:

BI-RADS 0
 Need additional imaging evaluation.

BI-RADS 1
 Negative mammogram.

BI-RADS 2
 Benign finding.

BI-RADS 3
 Probably benign finding; short-interval follow-up suggested.

BI-RADS 4
 Suspicious abnormality; biopsy should be considered.

BI-RADS 5
 Highly suggestive of malignancy; appropriate action should be taken.

BI-RADS 6
 Known biopsy-proven malignancy; appropriate action should be taken.

NOTE:
In compliance with Federal regulations, the results of this mammogram are being sent to the patient.

------------- REPORT [id number] -------------
[facility]
[address]
[place]/MS/DE REYES, MIKHAIL
We wish to report the following on your recent mammography examination. We are sending a report to your referring physician or other health care provider.
FINDING: Normal-no evidence of cancer

This statement is mandated by the [place], Department of Health.
Your examination was performed by one of our technologists, who are registered radiological technologists and also specially certified in mammography:
___
Marzan, Heron (M)

Your mammogram was interpreted by our radiologist.

( 
Apple Martha, M.D.

(Annual Breast Examination by a physician or other health care provider
(Annual Mammography Screening beginning at age 40
(Monthly Breast Self Examination

## 2023-05-30 ENCOUNTER — Other Ambulatory Visit: Payer: Self-pay

## 2023-05-30 ENCOUNTER — Encounter (INDEPENDENT_AMBULATORY_CARE_PROVIDER_SITE_OTHER): Payer: Self-pay | Admitting: INTERVENTIONAL CARDIOLOGY

## 2023-05-30 ENCOUNTER — Other Ambulatory Visit (INDEPENDENT_AMBULATORY_CARE_PROVIDER_SITE_OTHER): Payer: Self-pay | Admitting: NURSE PRACTITIONER

## 2023-05-30 ENCOUNTER — Ambulatory Visit: Payer: Medicare Other | Attending: INTERVENTIONAL CARDIOLOGY | Admitting: INTERVENTIONAL CARDIOLOGY

## 2023-05-30 VITALS — BP 130/53 | HR 70 | Ht 61.0 in | Wt 138.0 lb

## 2023-05-30 DIAGNOSIS — I493 Ventricular premature depolarization: Secondary | ICD-10-CM | POA: Insufficient documentation

## 2023-05-30 DIAGNOSIS — E785 Hyperlipidemia, unspecified: Secondary | ICD-10-CM | POA: Insufficient documentation

## 2023-05-30 DIAGNOSIS — I119 Hypertensive heart disease without heart failure: Secondary | ICD-10-CM | POA: Insufficient documentation

## 2023-05-30 DIAGNOSIS — I10 Essential (primary) hypertension: Secondary | ICD-10-CM | POA: Insufficient documentation

## 2023-05-30 DIAGNOSIS — Z8249 Family history of ischemic heart disease and other diseases of the circulatory system: Secondary | ICD-10-CM

## 2023-05-30 MED ORDER — ROSUVASTATIN 10 MG TABLET
20.0000 mg | ORAL_TABLET | Freq: Every day | ORAL | 3 refills | Status: DC
Start: 2023-05-30 — End: 2023-07-18

## 2023-05-30 NOTE — Progress Notes (Addendum)
[facility]    Cathy Nichols, 67 y.o. female  Date of Service: 05/30/2023  Date of Birth:  1955-08-30  PCP:  No Pcp  Chief Complaint   Patient presents with    Follow Up     Ventricular Ectopy     Hypertension    Hyperlipidemia        HPI:    The patient has a history of palpitations and ventricular ectopy.  Echocardiogram and Holter monitor in 2015 showed fairly normal echo, Holter monitor showed frequent PVCs, about 1400 beats in 24 hours.  She also had some heart rates in the 160s while exerting herself.  She exercises regularly, she is a Runner, broadcasting/film/video in [place].  Repeat Holter monitor on beta-blocker showed some low heart rates, but no high-grade AV block or advanced AV block.  There was some infrequent tachyarrhythmias.  Repeat echo in December 2022 showed normal LV function with EF 55-60%, mild diastolic dysfunction, mild TR.    05/31/22 The patient is here for yearly f/u.  She denies any significant chest pains or shortness of breath.  Denies any palpitations.  She has been tolerating medications well except for Crestor, she is wondering if this was contributing to leg cramps.  She did stop it about a month ago, but her symptoms did not really change much.  She states her legs cramp significantly when she starts walking, longer she walks though the better it gets. Labs in October showed BUN 17, creatinine 0.74, sodium 140, potassium 5.0, total cholesterol 183, triglycerides 107, HDL 57, LDL 107.    05/30/23 The patient is here for yearly f/u. She denies any significant palpitations.  She states that she is sensitive to caffeine, if she drinks too much, she will have palpitations.  She denies any chest pains or sob.  She remains active, but is having a harder time due to her osteoarthritis and also a torn meniscus.  She has been taking vazalore, but having a hard time finding this so currently not taking.  No other complaints.      EKG: NSR, rate 67  LAB: 03/14/23 labs total cholesterol 172, trig  108, HDL 45, LDL 105      Past Medical History:   Diagnosis Date    Bradycardia     Dyslipidemia     Heart palpitations     Ventricular ectopy        Past Surgical History:   Procedure Laterality Date    HX LITHOTRIPSY      SINUS SURGERY         Current Outpatient Medications   Medication Sig    alendronate (FOSAMAX) 70 mg Oral Tablet TAKE 1 TABLET BY MOUTH WEEKLY IN THE MORNING AT LEAST 30 MINS BEFORE 1ST FOOD/DRINK/MED OF THE DAY    aspirin (VAZALORE) 81 mg Oral Capsule Take by mouth Once a day (Patient not taking: Reported on 05/30/2023)    Cholecalciferol, Vitamin D3, 50 mcg (2,000 unit) Oral Capsule Take by mouth Once a day    levothyroxine (SYNTHROID) 88 mcg Oral Tablet Take 1 Tablet (88 mcg total) by mouth    metoprolol succinate (TOPROL-XL) 25 mg Oral Tablet Sustained Release 24 hr Take 12.5 Tablets (312.5 mg total) by mouth Every morning    MICONAZOLE 3 200 mg Vaginal Suppository Insert 1 Suppository (200 mg total) into the vagina Every night    miconazole nitrate (SECURA) 2 % Cream Apply 1 Application topically    montelukast (SINGULAIR) 10 mg Oral Tablet Take  1 Tablet (10 mg total) by mouth Every evening 12.20.22    rosuvastatin (CRESTOR) 10 mg Oral Tablet Take 2 Tablets (20 mg total) by mouth Once a day     ROS: Other than issues noted in HPI, all other systems were negative.    Exam:  Vitals:    05/30/23 0852   BP: (!) 130/53   Pulse: 70   SpO2: 97%   Weight: 62.6 kg (138 lb)   Height: 1.549 m (5\' 1" )   BMI: 26.07       General: No acute distress and appears stated age.    HEENT:Head normocephalic, atraumatic. ENT without erythema or injection, mucouse membranes moist.    Neck: No JVD, no carotid bruit. and supple, symmetrical, trachea midline.   Lungs: Clear to auscultation bilaterally.    Cardiovascular: Regular rate and rhythm, normal S1 S2, no murmur, no rub, or gallop, no thrill     Abdomen: Soft, non-tender and bowel sounds normal.    Extremities: Extremities normal, atraumatic, no cyanosis or  edema.    Skin: Skin warm and dry.    Neurologic: Alert and oriented x3.  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  Continue Toprol XL 12.5mg  daily  Increase Crestor to 20 mg daily    Patient has strong family history of CAD.  She has not had stress testing in the past.  Will obtain calcium score, as long as this is stable, return in 1 year for routine f/u.      Amedeo Kinsman, APRN,FNP-BC 05/30/2023 09:40    Interventional Cardiology attending: The patient was seen by me and examined as part of a shared service with the APP. The patient was clinically evaluated, and all pertinent lab results and imaging were reviewed. I reviewed the APP note and made any substantive changes necessary. I agree with the APP note, which reflects my medical decision making. Palpitations controlled. Unclear target LDL, will obtain Ca score for risk assessment and evaluate for presence of CAD.    Elam Dutch, MD  05/30/2023 09:47

## 2023-05-31 LAB — ECG W INTERP (AMB USE ONLY)(MUSE,IN CLINIC)
Atrial Rate: 67 {beats}/min
Calculated P Axis: 28 degrees
Calculated R Axis: 49 degrees
Calculated T Axis: 49 degrees
PR Interval: 120 ms
QRS Duration: 92 ms
QT Interval: 386 ms
QTC Calculation: 407 ms
Ventricular rate: 67 {beats}/min

## 2023-07-03 ENCOUNTER — Ambulatory Visit (HOSPITAL_COMMUNITY): Payer: Self-pay

## 2023-07-18 ENCOUNTER — Other Ambulatory Visit (INDEPENDENT_AMBULATORY_CARE_PROVIDER_SITE_OTHER): Payer: Self-pay | Admitting: NURSE PRACTITIONER

## 2023-07-18 MED ORDER — PRAVASTATIN 20 MG TABLET
20.0000 mg | ORAL_TABLET | Freq: Every evening | ORAL | 1 refills | Status: DC
Start: 2023-07-18 — End: 2024-02-13

## 2023-07-18 NOTE — Telephone Encounter (Signed)
Med not covered by insurance spoke with patient requested different med Pravastatin 20 mg to be sent to [facility] pt prefers 90 day supply

## 2023-07-24 ENCOUNTER — Other Ambulatory Visit: Payer: Self-pay

## 2023-07-24 ENCOUNTER — Ambulatory Visit
Admission: RE | Admit: 2023-07-24 | Discharge: 2023-07-24 | Disposition: A | Discharge status: Home / Self Care | Payer: Medicare Other | Source: Ambulatory Visit | Attending: NURSE PRACTITIONER

## 2023-07-24 DIAGNOSIS — I493 Ventricular premature depolarization: Secondary | ICD-10-CM | POA: Insufficient documentation

## 2023-07-24 DIAGNOSIS — E785 Hyperlipidemia, unspecified: Secondary | ICD-10-CM | POA: Insufficient documentation

## 2023-07-24 DIAGNOSIS — I119 Hypertensive heart disease without heart failure: Secondary | ICD-10-CM | POA: Insufficient documentation

## 2023-07-26 DIAGNOSIS — M47819 Spondylosis without myelopathy or radiculopathy, site unspecified: Secondary | ICD-10-CM

## 2023-07-26 DIAGNOSIS — E785 Hyperlipidemia, unspecified: Secondary | ICD-10-CM

## 2023-07-26 DIAGNOSIS — I119 Hypertensive heart disease without heart failure: Secondary | ICD-10-CM

## 2023-07-26 DIAGNOSIS — I493 Ventricular premature depolarization: Secondary | ICD-10-CM

## 2023-07-26 DIAGNOSIS — K7689 Other specified diseases of liver: Secondary | ICD-10-CM

## 2024-02-12 ENCOUNTER — Other Ambulatory Visit (INDEPENDENT_AMBULATORY_CARE_PROVIDER_SITE_OTHER): Payer: Self-pay | Admitting: NURSE PRACTITIONER

## 2024-05-28 ENCOUNTER — Other Ambulatory Visit: Payer: Self-pay

## 2024-05-28 ENCOUNTER — Encounter (INDEPENDENT_AMBULATORY_CARE_PROVIDER_SITE_OTHER): Payer: Self-pay | Admitting: INTERVENTIONAL CARDIOLOGY

## 2024-05-28 ENCOUNTER — Ambulatory Visit: Payer: Self-pay | Attending: INTERVENTIONAL CARDIOLOGY | Admitting: INTERVENTIONAL CARDIOLOGY

## 2024-05-28 VITALS — BP 155/95 | HR 69 | Ht 61.0 in | Wt 137.0 lb

## 2024-05-28 DIAGNOSIS — I10 Essential (primary) hypertension: Secondary | ICD-10-CM

## 2024-05-28 DIAGNOSIS — E785 Hyperlipidemia, unspecified: Secondary | ICD-10-CM

## 2024-05-28 DIAGNOSIS — I493 Ventricular premature depolarization: Secondary | ICD-10-CM

## 2024-05-28 NOTE — Progress Notes (Addendum)
 [facility]     Cathy Nichols, 68 y.o. female  Date of Service: 05/28/2024  Date of Birth:  09/08/1955  PCP:  Eleanor LOISE Hastings, PA-C  Chief Complaint   Patient presents with    Hypertension    Hyperlipidemia        HPI:    The patient has a history of palpitations and ventricular ectopy.  Echocardiogram and Holter monitor in 2015 showed fairly normal echo, Holter monitor showed frequent PVCs, about 1400 beats in 24 hours.  She also had some heart rates in the 160s while exerting herself.  She exercises regularly, she is a runner, broadcasting/film/video in [place].  Repeat Holter monitor on beta-blocker showed some low heart rates, but no high-grade AV block or advanced AV block.  There was some infrequent tachyarrhythmias.  Repeat echo in December 2022 showed normal LV function with EF 55-60%, mild diastolic dysfunction, mild TR.    05/30/23 The patient is here for yearly f/u. She denies any significant palpitations.  She states that she is sensitive to caffeine, if she drinks too much, she will have palpitations.  She denies any chest pains or sob.  She remains active, but is having a harder time due to her osteoarthritis and also a torn meniscus.  She has been taking vazalore, but having a hard time finding this so currently not taking.  No other complaints.    05/28/24 The patient is here for yearly f/u for palpitations and HTN. She denies any chest pains or sob.  She has palpitations at times, but they are stable.  She stays active and walks regularly with no limitations.  She had issues with cramping on Crestor , improved with changing to Pravastatin .  Blood pressure elevated today, she does not check regularly at home.     EKG:  Normal sinus rhythm, rate 66  LAB:  Labs in June showed total cholesterol 159, triglycerides 130, HDL 50, LDL 83, A1c 5.3, sodium 142, potassium 4.3, BUN 13, creatinine 0.69, TSH 1.5-2       Past Medical History:   Diagnosis Date    Bradycardia     Dyslipidemia     Heart  palpitations     Ventricular ectopy        Past Surgical History:   Procedure Laterality Date    HX LITHOTRIPSY      SINUS SURGERY         Current Outpatient Medications   Medication Sig    alendronate (FOSAMAX) 70 mg Oral Tablet TAKE 1 TABLET BY MOUTH WEEKLY IN THE MORNING AT LEAST 30 MINS BEFORE 1ST FOOD/DRINK/MED OF THE DAY    aspirin (VAZALORE) 81 mg Oral Capsule Take by mouth Once a day (Patient not taking: Reported on 05/28/2024)    Cholecalciferol, Vitamin D3, 50 mcg (2,000 unit) Oral Capsule Take by mouth Once a day    levothyroxine (SYNTHROID) 88 mcg Oral Tablet Take 1 Tablet (88 mcg total) by mouth    metoprolol succinate (TOPROL-XL) 25 mg Oral Tablet Sustained Release 24 hr Take 0.5 Tablets (12.5 mg total) by mouth Twice daily    MICONAZOLE 3 200 mg Vaginal Suppository Insert 1 Suppository (200 mg total) into the vagina Every night    miconazole nitrate (SECURA) 2 % Cream Apply 1 Application topically    montelukast (SINGULAIR) 10 mg Oral Tablet Take 1 Tablet (10 mg total) by mouth Every evening 12.20.22 (Patient not taking: Reported on 05/28/2024)    pravastatin  (PRAVACHOL ) 20 mg Oral Tablet  TAKE 1 TABLET BY MOUTH EVERY DAY IN THE EVENING     ROS: Other than issues noted in HPI, all other systems were negative.     Exam:  Vitals:    05/28/24 0947   BP: (!) 155/95   Pulse: 69   SpO2: 97%   Weight: 62.1 kg (137 lb)   Height: 1.549 m (5' 1)   BMI: 25.89       General: No acute distress and appears stated age.    HEENT:Head normocephalic, atraumatic. ENT without erythema or injection, mucouse membranes moist.    Neck: No JVD, no carotid bruit. and supple, symmetrical, trachea midline.   Lungs: Clear to auscultation bilaterally.    Cardiovascular: Regular rate and rhythm, normal S1 S2, no murmur, no rub, or gallop, no thrill     Abdomen: Soft, non-tender and bowel sounds normal.    Extremities: Extremities normal, atraumatic, no cyanosis or edema.    Skin: Skin warm and dry.    Neurologic: Alert and oriented  x3.  Psych: Mood and affect congruent for age and gender     Orders placed this visit:  Orders Placed This Encounter    ECG 12 Lead w/ Interp ([facility]  - In Clinic, Same Day)       Assessment/Plan:  Ventricular ectopy    Essential hypertension    Hyperlipidemia, unspecified hyperlipidemia type    Continue pravastatin  20 mg daily  Continue Toprol 12.5 mg b.i.d.    Monitor blood pressure at home and keep log, parameters given.  She has an appointment with PCP in January or February.  Recommend keeping log until then and adjustments can be made at that time.  Discussed Norvasc would be a good choice for her if needed.  Return in 1 year for routine f/u.      Leandrew JINNY Batty, APRN, CNP 05/28/2024 10:12    Interventional Cardiology attending: The patient was seen by me and examined as part of a shared service with the APP. The patient was clinically evaluated, and all pertinent lab results and imaging were reviewed. I reviewed the APP note and made any substantive changes necessary. I agree with the APP note, which reflects my medical decision making. HTN, HL, ventricular ectopy with good control. Monitor BP at home. Continue routine follow up in 1 year.    Garnette Sink, MD  05/28/2024 10:39

## 2024-05-29 LAB — ECG 12 LEAD W/ INTERP (AMB USE ONLY) (MUSE, IN CLINC) (93005/93010)
Atrial Rate: 66 {beats}/min
Calculated P Axis: 52 degrees
Calculated R Axis: 62 degrees
Calculated T Axis: 44 degrees
PR Interval: 130 ms
QRS Duration: 94 ms
QT Interval: 396 ms
QTC Calculation: 415 ms
Ventricular rate: 66 {beats}/min

## 2025-05-28 ENCOUNTER — Ambulatory Visit (INDEPENDENT_AMBULATORY_CARE_PROVIDER_SITE_OTHER): Payer: Self-pay | Admitting: PHYSICIAN ASSISTANT
